# Patient Record
Sex: Male | Born: 1960
Health system: Southern US, Community
[De-identification: ages and names within clinical notes are randomized; demographics above are authoritative.]

## PROBLEM LIST (undated history)

## (undated) DIAGNOSIS — F32A Depression, unspecified: Secondary | ICD-10-CM

## (undated) DIAGNOSIS — F329 Major depressive disorder, single episode, unspecified: Secondary | ICD-10-CM

## (undated) DIAGNOSIS — M47812 Spondylosis without myelopathy or radiculopathy, cervical region: Secondary | ICD-10-CM

## (undated) DIAGNOSIS — B1920 Unspecified viral hepatitis C without hepatic coma: Secondary | ICD-10-CM

## (undated) HISTORY — DX: Depression, unspecified: F32.A

## (undated) HISTORY — PX: TONSILLECTOMY: SUR1361

## (undated) HISTORY — PX: CHOLECYSTECTOMY: SHX55

## (undated) HISTORY — DX: Major depressive disorder, single episode, unspecified: F32.9

## (undated) HISTORY — PX: SPINE SURGERY: SHX786

---

## 2006-03-26 ENCOUNTER — Ambulatory Visit: Payer: Self-pay | Admitting: Internal Medicine

## 2006-04-01 ENCOUNTER — Ambulatory Visit: Payer: Self-pay | Admitting: Surgery

## 2006-08-31 ENCOUNTER — Emergency Department: Payer: Self-pay | Admitting: General Practice

## 2009-08-05 ENCOUNTER — Emergency Department: Payer: Self-pay | Admitting: Emergency Medicine

## 2009-08-08 ENCOUNTER — Emergency Department: Payer: Self-pay | Admitting: Internal Medicine

## 2011-03-16 ENCOUNTER — Emergency Department: Payer: Self-pay | Admitting: Emergency Medicine

## 2012-04-24 ENCOUNTER — Emergency Department: Payer: Self-pay | Admitting: Emergency Medicine

## 2015-06-27 ENCOUNTER — Emergency Department: Payer: Self-pay

## 2015-06-27 ENCOUNTER — Emergency Department
Admission: EM | Admit: 2015-06-27 | Discharge: 2015-06-27 | Disposition: A | Discharge status: Home / Self Care | Payer: Self-pay | Attending: Emergency Medicine | Admitting: Emergency Medicine

## 2015-06-27 ENCOUNTER — Encounter: Payer: Self-pay | Admitting: Emergency Medicine

## 2015-06-27 DIAGNOSIS — Z87891 Personal history of nicotine dependence: Secondary | ICD-10-CM | POA: Insufficient documentation

## 2015-06-27 DIAGNOSIS — M5412 Radiculopathy, cervical region: Secondary | ICD-10-CM | POA: Insufficient documentation

## 2015-06-27 MED ORDER — PREDNISONE 10 MG PO TABS: ORAL_TABLET | ORAL | Status: DC

## 2015-06-27 NOTE — ED Provider Notes (Signed)
[facility] Emergency Department Provider Note  ____________________________________________  Time seen: Approximately 9:28 AM  I have reviewed the triage vital signs and the nursing notes.   HISTORY  Chief Complaint Extremity Weakness   HPI Matthew Mercado is a 54 y.o. male with complaint of left arm pain for 1 month. Patient states that there is a pressure, burning sensation from his neck down his left arm that is minimally relieved by Advil which he is taking every 3 hours. Patient denies any GI upset at this time. Patient denies any chest pain or difficulty breathing. He denies any cardiac history. Patient states that he had a neck injury approximately 8 years ago where he fractured his neck. He denies any difficulty or problems since that time. Pain is worse with lifting his arm and pain is decreased with left armrest. He states that he feels like he has some weakness in his grip of his left hand. He took Advil this morning prior to his arrival in the emergency room. Currently he rates his pain a 4 out of 10.   History reviewed. No pertinent past medical history.  There are no active problems to display for this patient.   History reviewed. No pertinent past surgical history.  Current Outpatient Rx  Name  Route  Sig  Dispense  Refill  . predniSONE (DELTASONE) 10 MG tablet      Take 6 tablets  today, on day 2 take 5 tablets, day 3 take 4 tablets, day 4 take 3 tablets, day 5 take  2 tablets and 1 tablet the last day   21 tablet   0     Allergies Review of patient's allergies indicates no known allergies.  No family history on file.  Social History Social History  Substance Use Topics  . Smoking status: Former Research scientist (life sciences)  . Smokeless tobacco: None  . Alcohol Use: No    Review of Systems Constitutional: No fever/chills ENT: No sore throat. Cardiovascular: Denies chest pain. Respiratory: Denies shortness of breath. Gastrointestinal:   No  nausea, no vomiting.  Musculoskeletal: Negative for back pain. Positive left arm pain. Skin: Negative for rash. Neurological: Negative for headaches, focal weakness or numbness.  10-point ROS otherwise negative.  ____________________________________________   PHYSICAL EXAM:  VITAL SIGNS: ED Triage Vitals  Enc Vitals Group     BP 06/27/15 0910 149/89 mmHg     Pulse Rate 06/27/15 0910 74     Resp 06/27/15 0910 18     Temp 06/27/15 0910 98.9 F (37.2 C)     Temp Source 06/27/15 0910 Oral     SpO2 06/27/15 0910 96 %     Weight 06/27/15 0910 205 lb (92.987 kg)     Height 06/27/15 0910 5\' 11"  (1.803 m)     Head Cir --      Peak Flow --      Pain Score 06/27/15 0910 4     Pain Loc --      Pain Edu? --      Excl. in Center? --     Constitutional: Alert and oriented. Well appearing and in no acute distress. Eyes: Conjunctivae are normal. PERRL. EOMI. Head: Atraumatic. Nose: No congestion/rhinnorhea. Neck: No stridor.  Minimal tenderness on palpation of cervical spine. Range of motion is guarded. Cardiovascular: Normal rate, regular rhythm. Grossly normal heart sounds.  Good peripheral circulation. Respiratory: Normal respiratory effort.  No retractions. Lungs CTAB. Gastrointestinal: Soft and nontender. No distention. No abdominal bruits. No CVA tenderness.  Musculoskeletal: As a motion of the neck and left arm no gross deformity was noted. Range of motion of left arm is restricted secondary to discomfort. There is some minimal tenderness on palpation of the cervical spine posteriorly. There is minimally decreased strength in the left arm in comparison to the right with gripping. Good muscle strength otherwise. Neurologic:  Normal speech and language. No gross focal neurologic deficits are appreciated. No gait instability. Skin:  Skin is warm, dry and intact. No rash noted. Psychiatric: Mood and affect are normal. Speech and behavior are  normal.  ____________________________________________   LABS (all labs ordered are listed, but only abnormal results are displayed)  Labs Reviewed - No data to display RADIOLOGY  X-rays cervical spine per radiologist shows minimal degenerative disc disease noted at C5-C6 with minimal grade 1 anterolisthesis ____________________________________________   PROCEDURES  Procedure(s) performed: None  Critical Care performed: No  ____________________________________________   INITIAL IMPRESSION / ASSESSMENT AND PLAN / ED COURSE  Pertinent labs & imaging results that were available during my care of the patient were reviewed by me and considered in my medical decision making (see chart for details).  Patient was started on prednisone Dosepak tapering from 60 mg. Patient was given the name of Dr. Rudene Christians at [facility] clinic orthopedics to follow-up with. ____________________________________________   FINAL CLINICAL IMPRESSION(S) / ED DIAGNOSES  Final diagnoses:  Cervical radiculopathy, acute      Johnn Hai, PA-C 06/27/15 [place], MD 06/27/15 1549

## 2015-06-27 NOTE — ED Provider Notes (Signed)
EKG read and interpreted by me shows normal sinus rhythm rate of 67 normal axis no acute ST-T wave changes EKG is essentially normal  Nena Polio, MD 06/27/15 1039

## 2015-06-27 NOTE — ED Notes (Signed)
Left arm pain/ radiating / numbness/ tingling increasing x1 month , no injury recalled

## 2015-06-27 NOTE — Discharge Instructions (Signed)
Cervical Radiculopathy Cervical radiculopathy means that a nerve in the neck is pinched or bruised. This can cause pain or loss of feeling (numbness) that runs from your neck to your arm and fingers. HOME CARE Managing Pain  Take over-the-counter and prescription medicines only as told by your doctor.  If directed, put ice on the injured or painful area.  Put ice in a plastic bag.  Place a towel between your skin and the bag.  Leave the ice on for 20 minutes, 2-3 times per day.  If ice does not help, you can try using heat. Take a warm shower or warm bath, or use a heat pack as told by your doctor.  You may try a gentle neck and shoulder massage. Activity  Rest as needed. Follow instructions from your doctor about any activities to avoid.  Do exercises as told by your doctor or physical therapist. General Instructions   If you were given a soft collar, wear it as told by your doctor.  Use a flat pillow when you sleep.  Keep all follow-up visits as told by your doctor. This is important. GET HELP IF:  Your condition does not improve with treatment. GET HELP RIGHT AWAY IF:   Your pain gets worse and is not controlled with medicine.  You lose feeling or feel weak in your hand, arm, face, or leg.  You have a fever.  You have a stiff neck.  You cannot control when you poop or pee (have incontinence).  You have trouble with walking, balance, or talking.   This information is not intended to replace advice given to you by your health care provider. Make sure you discuss any questions you have with your health care provider.   Document Released: 06/04/2011 Document Revised: 03/06/2015 Document Reviewed: 08/09/2014 Elsevier Interactive Patient Education 2016 [employer].   Call and make an appointment with Dr. Rudene Christians for your cervical radicular pain Begin taking prednisone today as directed.

## 2015-06-27 NOTE — ED Notes (Signed)
States he developed pain to left arm for over a month. No injury. Describes pain as pressure at times then burning min relief with OTC ibuprofen  States his grips area weaker on left at times    Good pulses and circulation  Denies any other sx;s no cp or SOB

## 2015-07-11 DIAGNOSIS — M5412 Radiculopathy, cervical region: Secondary | ICD-10-CM | POA: Diagnosis not present

## 2015-07-22 ENCOUNTER — Emergency Department
Admission: EM | Admit: 2015-07-22 | Discharge: 2015-07-23 | Disposition: A | Discharge status: Home / Self Care | Payer: 59 | Attending: Emergency Medicine | Admitting: Emergency Medicine

## 2015-07-22 ENCOUNTER — Encounter: Payer: Self-pay | Admitting: Emergency Medicine

## 2015-07-22 DIAGNOSIS — R000 Tachycardia, unspecified: Secondary | ICD-10-CM | POA: Diagnosis not present

## 2015-07-22 DIAGNOSIS — K529 Noninfective gastroenteritis and colitis, unspecified: Secondary | ICD-10-CM | POA: Diagnosis not present

## 2015-07-22 DIAGNOSIS — E860 Dehydration: Secondary | ICD-10-CM | POA: Diagnosis not present

## 2015-07-22 DIAGNOSIS — R112 Nausea with vomiting, unspecified: Secondary | ICD-10-CM | POA: Diagnosis present

## 2015-07-22 DIAGNOSIS — Z7952 Long term (current) use of systemic steroids: Secondary | ICD-10-CM | POA: Diagnosis not present

## 2015-07-22 DIAGNOSIS — Z87891 Personal history of nicotine dependence: Secondary | ICD-10-CM | POA: Diagnosis not present

## 2015-07-22 LAB — COMPREHENSIVE METABOLIC PANEL WITH GFR
ALT: 108 U/L — ABNORMAL HIGH (ref 17–63)
AST: 63 U/L — ABNORMAL HIGH (ref 15–41)
Albumin: 5 g/dL (ref 3.5–5.0)
Alkaline Phosphatase: 62 U/L (ref 38–126)
Anion gap: 16 — ABNORMAL HIGH (ref 5–15)
BUN: 26 mg/dL — ABNORMAL HIGH (ref 6–20)
CO2: 22 mmol/L (ref 22–32)
Calcium: 9.6 mg/dL (ref 8.9–10.3)
Chloride: 103 mmol/L (ref 101–111)
Creatinine, Ser: 1.22 mg/dL (ref 0.61–1.24)
GFR calc Af Amer: >60 mL/min
GFR calc non Af Amer: >60 mL/min
Glucose, Bld: 171 mg/dL — ABNORMAL HIGH (ref 65–99)
Potassium: 4.1 mmol/L (ref 3.5–5.1)
Sodium: 141 mmol/L (ref 135–145)
Total Bilirubin: 1.7 mg/dL — ABNORMAL HIGH (ref 0.3–1.2)
Total Protein: 8.3 g/dL — ABNORMAL HIGH (ref 6.5–8.1)

## 2015-07-22 LAB — CBC WITH DIFFERENTIAL/PLATELET
Basophils Absolute: 0.1 K/uL (ref 0–0.1)
Basophils Relative: 0 %
Eosinophils Absolute: 0.2 K/uL (ref 0–0.7)
Eosinophils Relative: 1 %
HCT: 54.7 % — ABNORMAL HIGH (ref 40.0–52.0)
Hemoglobin: 18.8 g/dL — ABNORMAL HIGH (ref 13.0–18.0)
Lymphocytes Relative: 6 %
Lymphs Abs: 1.4 K/uL (ref 1.0–3.6)
MCH: 31.2 pg (ref 26.0–34.0)
MCHC: 34.4 g/dL (ref 32.0–36.0)
MCV: 90.7 fL (ref 80.0–100.0)
Monocytes Absolute: 1.7 K/uL — ABNORMAL HIGH (ref 0.2–1.0)
Monocytes Relative: 8 %
Neutro Abs: 18.2 K/uL — ABNORMAL HIGH (ref 1.4–6.5)
Neutrophils Relative %: 85 %
Platelets: 373 K/uL (ref 150–440)
RBC: 6.03 MIL/uL — ABNORMAL HIGH (ref 4.40–5.90)
RDW: 13.7 % (ref 11.5–14.5)
WBC: 21.5 K/uL — ABNORMAL HIGH (ref 3.8–10.6)

## 2015-07-22 LAB — ETHANOL: Alcohol, Ethyl (B): <5 mg/dL

## 2015-07-22 LAB — LIPASE, BLOOD: LIPASE: 32 U/L (ref 11–51)

## 2015-07-22 MED ORDER — ONDANSETRON HCL 4 MG/2ML IJ SOLN
4.0000 mg | Freq: Once | INTRAMUSCULAR | Status: AC
  Administered 2015-07-22: 4 mg via INTRAVENOUS
  Filled 2015-07-22: qty 2

## 2015-07-22 MED ORDER — SODIUM CHLORIDE 0.9 % IV BOLUS (SEPSIS)
1000.0000 mL | Freq: Once | INTRAVENOUS | Status: AC
  Administered 2015-07-22: 1000 mL via INTRAVENOUS

## 2015-07-22 NOTE — ED Notes (Signed)
Patient repositioned to recliner chair for comfort. Much improved. Wife at bedside. Awaiting fluids to be completed and will draw labs.

## 2015-07-22 NOTE — ED Notes (Addendum)
Pt to triage via w/c, mask in place with no distress noted; Pt reports N/V/D x 3hrs; denies abd pain at present but st having abd craming with vomiting episode

## 2015-07-22 NOTE — ED Provider Notes (Addendum)
[facility] Emergency Department Provider Note  ____________________________________________   I have reviewed the triage vital signs and the nursing notes.   HISTORY  Chief Complaint Emesis and Diarrhea    HPI Matthew Mercado is a 55 y.o. male who presents today complaining of nausea vomiting and diarrhea. Patient has had these symptoms since approximately 4 PM this evening. He has vomited more times and he can count. He has had copious nonbloody nonbilious emesis and extensive nonbloody non-melanotic brown diarrhea. Positive sick contacts large community burden of similar. Patient denies any fever or chills. He states that he began to feel badly this afternoon. He denies any focal abdominal pain and states his cramping is gone. He states he filled up an entire trash bucket with vomit. It is unclear how they've the trash bucket was.  History reviewed. No pertinent past medical history.  There are no active problems to display for this patient.   Past Surgical History  Procedure Laterality Date  . Cholecystectomy    . Tonsillectomy      Current Outpatient Rx  Name  Route  Sig  Dispense  Refill  . predniSONE (DELTASONE) 10 MG tablet      Take 6 tablets  today, on day 2 take 5 tablets, day 3 take 4 tablets, day 4 take 3 tablets, day 5 take  2 tablets and 1 tablet the last day   21 tablet   0     Allergies Review of patient's allergies indicates no known allergies.  No family history on file.  Social History Social History  Substance Use Topics  . Smoking status: Former Research scientist (life sciences)  . Smokeless tobacco: None  . Alcohol Use: No    Review of Systems Constitutional: No fever/chills Eyes: No visual changes. ENT: No sore throat. No stiff neck no neck pain Cardiovascular: Denies chest pain. Respiratory: Denies shortness of breath. Gastrointestinal:  See history of present illness Genitourinary: Negative for dysuria. Musculoskeletal: Negative  lower extremity swelling Skin: Negative for rash. Neurological: Negative for headaches, focal weakness or numbness. 10-point ROS otherwise negative.  ____________________________________________   PHYSICAL EXAM:  VITAL SIGNS: ED Triage Vitals  Enc Vitals Group     BP 07/22/15 2112 155/125 mmHg     Pulse Rate 07/22/15 2112 131     Resp 07/22/15 2112 24     Temp 07/22/15 2112 97.7 F (36.5 C)     Temp Source 07/22/15 2112 Oral     SpO2 07/22/15 2112 98 %     Weight 07/22/15 2112 210 lb (95.255 kg)     Height 07/22/15 2112 5\' 11"  (1.803 m)     Head Cir --      Peak Flow --      Pain Score 07/22/15 2122 2     Pain Loc --      Pain Edu? --      Excl. in [place]? --     Constitutional: Alert and oriented. Well appearing and in no acute distress. Eyes: Conjunctivae are normal. PERRL. EOMI. Head: Atraumatic. Nose: No congestion/rhinnorhea. Mouth/Throat: Mucous membranes are somewhat dry.  Oropharynx non-erythematous. Neck: No stridor.   Nontender with no meningismus Cardiovascular: Tachycardia noted, mild, regular rhythm. Grossly normal heart sounds.  Good peripheral circulation. Respiratory: Normal respiratory effort.  No retractions. Lungs CTAB. Abdominal: Soft and nontender. No distention. No guarding no rebound Back:  There is no focal tenderness or step off there is no midline tenderness there are no lesions noted. there is no CVA  tenderness Musculoskeletal: No lower extremity tenderness. No joint effusions, no DVT signs strong distal pulses no edema Neurologic:  Normal speech and language. No gross focal neurologic deficits are appreciated.  Skin:  Skin is warm, dry and intact. No rash noted. Psychiatric: Mood and affect are normal. Speech and behavior are normal.  ____________________________________________   LABS (all labs ordered are listed, but only abnormal results are displayed)  Labs Reviewed  CBC WITH DIFFERENTIAL/PLATELET - Abnormal; Notable for the following:     WBC 21.5 (*)    RBC 6.03 (*)    Hemoglobin 18.8 (*)    HCT 54.7 (*)    Neutro Abs 18.2 (*)    Monocytes Absolute 1.7 (*)    All other components within normal limits  COMPREHENSIVE METABOLIC PANEL  ETHANOL  LIPASE, BLOOD   ____________________________________________  EKG  I personally interpreted any EKGs ordered by me or triage  ____________________________________________  RADIOLOGY  I reviewed any imaging ordered by me or triage that were performed during my shift ____________________________________________   PROCEDURES  Procedure(s) performed: None  Critical Care performed: None  ____________________________________________   INITIAL IMPRESSION / ASSESSMENT AND PLAN / ED COURSE  Pertinent labs & imaging results that were available during my care of the patient were reviewed by me and considered in my medical decision making (see chart for details).  A sheet with explosive diarrhea and vomiting tonight. Large viral burden of similar. Blood pressure is reassuring. No focal abdominal discomfort to deep abdominal palpation. No evidence of obstruction or other acute intra-abdominal pathology requiring surgery or imaging at this time. We are giving the patient copious IV fluid, his white count is elevated but it is also noted that his hemoglobin is 18.8 suggestive of significant contractile leukocytosis/polycythemia likely secondary to acute volume depletion. In addition, patient is finishing up a steroid taper for his chronic neck pain. This is more than enough to send up his white count as well. We will replace his volume and reassess. I don't think a white count on its own mandates further intervention. There is no evidence at this time of the GI bleed and is heme certainly is not consistent with that either.  ----------------------------------------- 10:58 PM on 07/22/2015 -----------------------------------------  Patient feeling much better, he has serial  abdominal exams show no evidence of discomfort. Patient's sugar is elevated. Patient is taking steroids for chronic neck pain. The family the need for close outpatient follow-up and repeat his hemoglobin A1c. We will repeat his BMP to make sure that his anion gap closes. I suspect however it is because of acute dehydration. His oxygen saturation are 98 when I'm in the room.   ----------------------------------------- 11:32 PM on 07/22/2015 -----------------------------------------  Signed out to dr. Dahlia Client at the end of my shift. ____________________________________________   FINAL CLINICAL IMPRESSION(S) / ED DIAGNOSES  Final diagnoses:  None    Schuyler Amor, MD 07/22/15 2154  Schuyler Amor, MD 07/22/15 [place], MD 07/22/15 [place], MD 07/22/15 2333

## 2015-07-23 DIAGNOSIS — Z7952 Long term (current) use of systemic steroids: Secondary | ICD-10-CM | POA: Diagnosis not present

## 2015-07-23 DIAGNOSIS — R000 Tachycardia, unspecified: Secondary | ICD-10-CM | POA: Diagnosis not present

## 2015-07-23 DIAGNOSIS — Z87891 Personal history of nicotine dependence: Secondary | ICD-10-CM | POA: Diagnosis not present

## 2015-07-23 DIAGNOSIS — K529 Noninfective gastroenteritis and colitis, unspecified: Secondary | ICD-10-CM | POA: Diagnosis not present

## 2015-07-23 LAB — BASIC METABOLIC PANEL
ANION GAP: 4 — AB (ref 5–15)
BUN: 27 mg/dL — ABNORMAL HIGH (ref 6–20)
CO2: 26 mmol/L (ref 22–32)
Calcium: 7.7 mg/dL — ABNORMAL LOW (ref 8.9–10.3)
Chloride: 112 mmol/L — ABNORMAL HIGH (ref 101–111)
Creatinine, Ser: 1.18 mg/dL (ref 0.61–1.24)
GLUCOSE: 123 mg/dL — AB (ref 65–99)
POTASSIUM: 3.7 mmol/L (ref 3.5–5.1)
SODIUM: 142 mmol/L (ref 135–145)

## 2015-07-23 LAB — CBC WITH DIFFERENTIAL/PLATELET
BASOS ABS: 0 10*3/uL (ref 0–0.1)
Basophils Relative: 0 %
Eosinophils Absolute: 0.2 10*3/uL (ref 0–0.7)
Eosinophils Relative: 1 %
HEMATOCRIT: 45 % (ref 40.0–52.0)
HEMOGLOBIN: 15.3 g/dL (ref 13.0–18.0)
LYMPHS PCT: 7 %
Lymphs Abs: 1.1 10*3/uL (ref 1.0–3.6)
MCH: 31.8 pg (ref 26.0–34.0)
MCHC: 34 g/dL (ref 32.0–36.0)
MCV: 93.4 fL (ref 80.0–100.0)
Monocytes Absolute: 1.2 10*3/uL — ABNORMAL HIGH (ref 0.2–1.0)
Monocytes Relative: 8 %
NEUTROS ABS: 12.9 10*3/uL — AB (ref 1.4–6.5)
NEUTROS PCT: 84 %
Platelets: 228 10*3/uL (ref 150–440)
RBC: 4.81 MIL/uL (ref 4.40–5.90)
RDW: 13.5 % (ref 11.5–14.5)
WBC: 15.4 10*3/uL — AB (ref 3.8–10.6)

## 2015-07-23 MED ORDER — ONDANSETRON 4 MG PO TBDP: 4.0000 mg | ORAL_TABLET | Freq: Three times a day (TID) | ORAL | Status: DC | PRN

## 2015-07-23 NOTE — ED Notes (Signed)
Pt alert and oriented X4, active, cooperative, pt in NAD. RR even and unlabored, color WNL.  Pt informed to return if any life threatening symptoms occur.   

## 2015-07-23 NOTE — ED Provider Notes (Signed)
-----------------------------------------   1:40 AM on 07/23/2015 -----------------------------------------   Blood pressure 115/81, pulse 107, temperature 97.7 F (36.5 C), temperature source Oral, resp. rate 18, height 5\' 11"  (1.803 m), weight 210 lb (95.255 kg), SpO2 94 %.  Assuming care from Dr. Burlene Arnt.  In short, Matthew Mercado is a 55 y.o. male with a chief complaint of Emesis and Diarrhea .  Refer to the original H&P for additional details.  The current plan of care is to follow up the repeat labs and reassess the patient.  Patient's repeat blood work is improved. His H&H is within normal limits and his white blood cell count is decreased at 15. The patient after the third liter feels much improved and is rated be discharged home. He did still have some tachycardia but he reports that he feels well and he thinks it is okay. I discussed this with the patient and his wife and he said he just felt ready to go home. The patient be discharged home to follow-up with his primary care physician.   Loney Hering, MD 07/23/15 [phone]

## 2015-07-23 NOTE — Discharge Instructions (Signed)

## 2015-07-29 DIAGNOSIS — M5412 Radiculopathy, cervical region: Secondary | ICD-10-CM | POA: Diagnosis not present

## 2015-08-01 DIAGNOSIS — M5412 Radiculopathy, cervical region: Secondary | ICD-10-CM | POA: Diagnosis not present

## 2015-08-06 DIAGNOSIS — M5412 Radiculopathy, cervical region: Secondary | ICD-10-CM | POA: Diagnosis not present

## 2015-08-09 ENCOUNTER — Other Ambulatory Visit: Payer: Self-pay | Admitting: Student

## 2015-08-09 DIAGNOSIS — M5412 Radiculopathy, cervical region: Secondary | ICD-10-CM

## 2015-08-28 ENCOUNTER — Ambulatory Visit
Admission: RE | Admit: 2015-08-28 | Discharge: 2015-08-28 | Disposition: A | Discharge status: Home / Self Care | Payer: 59 | Source: Ambulatory Visit | Attending: Student | Admitting: Student

## 2015-08-28 DIAGNOSIS — M47812 Spondylosis without myelopathy or radiculopathy, cervical region: Secondary | ICD-10-CM | POA: Insufficient documentation

## 2015-08-28 DIAGNOSIS — M50223 Other cervical disc displacement at C6-C7 level: Secondary | ICD-10-CM | POA: Diagnosis not present

## 2015-08-28 DIAGNOSIS — M5412 Radiculopathy, cervical region: Secondary | ICD-10-CM | POA: Diagnosis not present

## 2015-09-17 DIAGNOSIS — M542 Cervicalgia: Secondary | ICD-10-CM | POA: Diagnosis not present

## 2015-09-17 DIAGNOSIS — M5023 Other cervical disc displacement, cervicothoracic region: Secondary | ICD-10-CM | POA: Diagnosis not present

## 2015-09-17 DIAGNOSIS — M5412 Radiculopathy, cervical region: Secondary | ICD-10-CM | POA: Diagnosis not present

## 2015-09-17 DIAGNOSIS — Z6829 Body mass index (BMI) 29.0-29.9, adult: Secondary | ICD-10-CM | POA: Diagnosis not present

## 2015-09-23 DIAGNOSIS — M5412 Radiculopathy, cervical region: Secondary | ICD-10-CM | POA: Diagnosis not present

## 2015-09-30 ENCOUNTER — Ambulatory Visit (INDEPENDENT_AMBULATORY_CARE_PROVIDER_SITE_OTHER): Payer: 59

## 2015-09-30 ENCOUNTER — Ambulatory Visit
Admission: EM | Admit: 2015-09-30 | Discharge: 2015-09-30 | Disposition: A | Discharge status: Home / Self Care | Payer: 59 | Attending: Family Medicine | Admitting: Family Medicine

## 2015-09-30 ENCOUNTER — Encounter: Payer: Self-pay | Admitting: *Deleted

## 2015-09-30 DIAGNOSIS — S20211A Contusion of right front wall of thorax, initial encounter: Secondary | ICD-10-CM

## 2015-09-30 DIAGNOSIS — R05 Cough: Secondary | ICD-10-CM | POA: Diagnosis not present

## 2015-09-30 DIAGNOSIS — R0781 Pleurodynia: Secondary | ICD-10-CM | POA: Diagnosis not present

## 2015-09-30 HISTORY — DX: Spondylosis without myelopathy or radiculopathy, cervical region: M47.812

## 2015-09-30 LAB — URINALYSIS COMPLETE WITH MICROSCOPIC (ARMC ONLY)
BACTERIA UA: NONE SEEN
Bilirubin Urine: NEGATIVE
GLUCOSE, UA: NEGATIVE mg/dL
HGB URINE DIPSTICK: NEGATIVE
Ketones, ur: NEGATIVE mg/dL
LEUKOCYTES UA: NEGATIVE
NITRITE: NEGATIVE
PROTEIN: NEGATIVE mg/dL
RBC / HPF: NONE SEEN RBC/hpf (ref 0–5)
Specific Gravity, Urine: 1.025 (ref 1.005–1.030)
Squamous Epithelial / LPF: NONE SEEN
pH: 6 (ref 5.0–8.0)

## 2015-09-30 MED ORDER — KETOROLAC TROMETHAMINE 60 MG/2ML IM SOLN
60.0000 mg | Freq: Once | INTRAMUSCULAR | Status: AC
  Administered 2015-09-30: 60 mg via INTRAMUSCULAR

## 2015-09-30 MED ORDER — HYDROCODONE-ACETAMINOPHEN 5-325 MG PO TABS: 1.0000 | ORAL_TABLET | Freq: Four times a day (QID) | ORAL | Status: DC | PRN

## 2015-09-30 NOTE — ED Notes (Signed)
Pt fell last Thursday against a lawn mower but thought he was not injured. Over the weekend has had right flank and back pain that is worse today. Has hx of cervical disc problems and scheduled for surgery 4/26.

## 2015-09-30 NOTE — Discharge Instructions (Signed)
Chest Contusion A chest contusion is a deep bruise on your chest area. Contusions are the result of an injury that caused bleeding under the skin. A chest contusion may involve bruising of the skin, muscles, or ribs. The contusion may turn blue, purple, or yellow. Minor injuries will give you a painless contusion, but more severe contusions may stay painful and swollen for a few weeks. CAUSES  A contusion is usually caused by a blow, trauma, or direct force to an area of the body. SYMPTOMS   Swelling and redness of the injured area.  Discoloration of the injured area.  Tenderness and soreness of the injured area.  Pain. DIAGNOSIS  The diagnosis can be made by taking a history and performing a physical exam. An X-ray, CT scan, or MRI may be needed to determine if there were any associated injuries, such as broken bones (fractures) or internal injuries. TREATMENT  Often, the best treatment for a chest contusion is resting, icing, and applying cold compresses to the injured area. Deep breathing exercises may be recommended to reduce the risk of pneumonia. Over-the-counter medicines may also be recommended for pain control. HOME CARE INSTRUCTIONS   Put ice on the injured area.  Put ice in a plastic bag.  Place a towel between your skin and the bag.  Leave the ice on for 15-20 minutes, 03-04 times a day.  Only take over-the-counter or prescription medicines as directed by your caregiver. Your caregiver may recommend avoiding anti-inflammatory medicines (aspirin, ibuprofen, and naproxen) for 48 hours because these medicines may increase bruising.  Rest the injured area.  Perform deep-breathing exercises as directed by your caregiver.  Stop smoking if you smoke.  Do not lift objects over 5 pounds (2.3 kg) for 3 days or longer if recommended by your caregiver. SEEK IMMEDIATE MEDICAL CARE IF:   You have increased bruising or swelling.  You have pain that is getting worse.  You have  difficulty breathing.  You have dizziness, weakness, or fainting.  You have blood in your urine or stool.  You cough up or vomit blood.  Your swelling or pain is not relieved with medicines. MAKE SURE YOU:   Understand these instructions.  Will watch your condition.  Will get help right away if you are not doing well or get worse.   This information is not intended to replace advice given to you by your health care provider. Make sure you discuss any questions you have with your health care provider.   Document Released: 03/10/2001 Document Revised: 03/09/2012 Document Reviewed: 12/07/2011 Elsevier Interactive Patient Education 2016 [employer]  

## 2015-09-30 NOTE — ED Provider Notes (Signed)
CSN: [id number]     Arrival date & time 09/30/15  1501 History   First MD Initiated Contact with Patient 09/30/15 1600     Chief Complaint  Patient presents with  . Flank Pain  . Back Pain   (Consider location/radiation/quality/duration/timing/severity/associated sxs/prior Treatment) HPI  This a 55 year old male who was states that 4 days ago he was pushing a  900 pound lawnmower up a ramp onto a trailer when he tripped and fell against the cutting deck striking his right lateral ribs. he did not have pain at first but , a day and a half to 2 days later started to have severe pain. He says that it hurts worse with inspiration and sneezing is "unbearable". Has not Experienced any shortness of breath- his O2 sat today is 97% on room air. He has never had kidney stones before. Motion, particularly changing positions, is what seems to be the most bothersome for him. He seems to be comfortable at rest.    Past Medical History  Diagnosis Date  . Cervical spine degeneration    Past Surgical History  Procedure Laterality Date  . Cholecystectomy    . Tonsillectomy     Family History  Problem Relation Age of Onset  . Cancer Mother   . Cancer Father    Social History  Substance Use Topics  . Smoking status: Former Research scientist (life sciences)  . Smokeless tobacco: None  . Alcohol Use: No    Review of Systems  Constitutional: Positive for activity change. Negative for fever, chills and fatigue.  Musculoskeletal: Positive for back pain.  All other systems reviewed and are negative.   Allergies  Review of patient's allergies indicates no known allergies.  Home Medications   Prior to Admission medications   Medication Sig Start Date End Date Taking? Authorizing Provider  cyclobenzaprine (FLEXERIL) 10 MG tablet Take 10 mg by mouth 3 (three) times daily as needed for muscle spasms.   Yes Historical Provider, MD  HYDROcodone-acetaminophen (NORCO/VICODIN) 5-325 MG tablet Take 1-2 tablets by mouth every 6  (six) hours as needed. 09/30/15   Lorin Picket, PA-C  ondansetron (ZOFRAN ODT) 4 MG disintegrating tablet Take 1 tablet (4 mg total) by mouth every 8 (eight) hours as needed for nausea or vomiting. 07/23/15   Loney Hering, MD  predniSONE (DELTASONE) 10 MG tablet Take 6 tablets  today, on day 2 take 5 tablets, day 3 take 4 tablets, day 4 take 3 tablets, day 5 take  2 tablets and 1 tablet the last day 06/27/15   Johnn Hai, PA-C   Meds Ordered and Administered this Visit   Medications  ketorolac (TORADOL) injection 60 mg (60 mg Intramuscular Given 09/30/15 1703)    BP 146/94 mmHg  Pulse 85  Temp(Src) 97.7 F (36.5 C) (Oral)  Resp 16  Ht 5\' 11"  (1.803 m)  Wt 205 lb (92.987 kg)  BMI 28.60 kg/m2  SpO2 97% No data found.   Physical Exam  Constitutional: He is oriented to person, place, and time. He appears well-developed and well-nourished. No distress.  HENT:  Head: Normocephalic and atraumatic.  Eyes: Conjunctivae are normal. Pupils are equal, round, and reactive to light.  Neck: Normal range of motion. Neck supple.  Pulmonary/Chest: Effort normal.  Slightly decreased breath sounds in the right lower lobe  Abdominal: Soft. Bowel sounds are normal. There is no tenderness. There is no rebound and no guarding.  Musculoskeletal: He exhibits tenderness. He exhibits no edema.  Examination of the right ribs  shows a tenderness in the mid scapular line at approximately T89. This reproduces symptoms. Thoracic rotation to the right does also reproduce symptoms. Not Much tenderness laterally.  Neurological: He is alert and oriented to person, place, and time.  Skin: Skin is warm and dry. He is not diaphoretic.  Psychiatric: He has a normal mood and affect. His behavior is normal. Judgment and thought content normal.  Nursing note and vitals reviewed.   ED Course  Procedures (including critical care time)  Labs Review Labs Reviewed  URINALYSIS COMPLETEWITH MICROSCOPIC [facility]  ONLY)    Imaging Review Dg Ribs Unilateral W/chest Right  09/30/2015  CLINICAL DATA:  Fall 5 days ago. Pain after cough today. Initial encounter. EXAM: RIGHT RIBS AND CHEST - 3+ VIEW COMPARISON:  04/24/2012 FINDINGS: No fracture or other bone lesions are seen involving the ribs. There is no evidence of pneumothorax or pleural effusion. Both lungs are clear. Heart size and mediastinal contours are within normal limits. IMPRESSION: Negative. Electronically Signed   By: Monte Fantasia M.D.   On: 09/30/2015 16:47     Visual Acuity Review  Right Eye Distance:   Left Eye Distance:   Bilateral Distance:    Right Eye Near:   Left Eye Near:    Bilateral Near:         MDM   1. Contusion of ribs, right, initial encounter    New Prescriptions   HYDROCODONE-ACETAMINOPHEN (NORCO/VICODIN) 5-325 MG TABLET    Take 1-2 tablets by mouth every 6 (six) hours as needed.  Plan: 1. Test/x-ray results and diagnosis reviewed with patient 2. rx as per orders; risks, benefits, potential side effects reviewed with patient 3. Recommend supportive treatment with Heat or ice as necessary. I recommended the use of Motrin for less severe pain but given him some Vicodin for more severe pain. He needs to cough and deep breathe frequently supporting the area that is painful. If he needs any changes or worsening he needs to return here go the emergency room or see his primary care physician. I've given 2 days off work but have recommended that he be careful when lifting or pushing or pulling which may exaggerate his pain. May also prolong his recovery. 4. F/u prn if symptoms worsen or don't improve      Lorin Picket, PA-C 09/30/15 1710

## 2015-10-23 DIAGNOSIS — M50223 Other cervical disc displacement at C6-C7 level: Secondary | ICD-10-CM | POA: Diagnosis not present

## 2015-11-08 DIAGNOSIS — M542 Cervicalgia: Secondary | ICD-10-CM | POA: Diagnosis not present

## 2015-11-12 ENCOUNTER — Ambulatory Visit (INDEPENDENT_AMBULATORY_CARE_PROVIDER_SITE_OTHER): Payer: 59 | Admitting: Family Medicine

## 2015-11-12 ENCOUNTER — Encounter: Payer: Self-pay | Admitting: Family Medicine

## 2015-11-12 VITALS — BP 113/80 | HR 88 | Temp 97.9°F | Ht 70.2 in | Wt 203.0 lb

## 2015-11-12 DIAGNOSIS — Z0000 Encounter for general adult medical examination without abnormal findings: Secondary | ICD-10-CM

## 2015-11-12 DIAGNOSIS — Z1211 Encounter for screening for malignant neoplasm of colon: Secondary | ICD-10-CM

## 2015-11-12 DIAGNOSIS — R7309 Other abnormal glucose: Secondary | ICD-10-CM | POA: Diagnosis not present

## 2015-11-12 LAB — URINALYSIS, ROUTINE W REFLEX MICROSCOPIC
Bilirubin, UA: NEGATIVE
Glucose, UA: NEGATIVE
Ketones, UA: NEGATIVE
LEUKOCYTES UA: NEGATIVE
NITRITE UA: NEGATIVE
PH UA: 5.5 (ref 5.0–7.5)
Protein, UA: NEGATIVE
RBC, UA: NEGATIVE
Specific Gravity, UA: 1.02 (ref 1.005–1.030)
UUROB: 1 mg/dL (ref 0.2–1.0)

## 2015-11-12 LAB — BAYER DCA HB A1C WAIVED: HB A1C (BAYER DCA - WAIVED): 5.1 % (ref <7.0)

## 2015-11-12 NOTE — Progress Notes (Signed)
BP 113/80 mmHg  Pulse 88  Temp(Src) 97.9 F (36.6 C)  Ht 5' 10.2" (1.783 m)  Wt 203 lb (92.08 kg)  BMI 28.96 kg/m2  SpO2 99%   Subjective:    Patient ID: Matthew Mercado, male    DOB: 1960/10/31, 55 y.o.   MRN: [id number]  HPI: Matthew Mercado is a 55 y.o. male  Chief Complaint  Patient presents with  . re-establish care  . Annual Exam  Patient all in all doing well had cervical disc surgery for left arm radicular pain which resolved's day of surgery and is been recovering well. Has been off pain medicines from day 3 after surgery. Prior to that it's been doing well was treated in the ER for chest pain with a noncardiac diagnosis normal EKG. not taking any medication now Glucose nonfasting was noted to be high in the emergency room no mention of glucose problems during surgery but with those issues will check hemoglobin A1c today. Patient also needs colonoscopy as hasn't had over 50 colonoscopy yet.   Relevant past medical, surgical, family and social history reviewed and updated as indicated. Interim medical history since our last visit reviewed. Allergies and medications reviewed and updated.  Review of Systems  Constitutional: Negative.   HENT: Negative.   Eyes: Negative.   Respiratory: Negative.   Cardiovascular: Negative.   Gastrointestinal: Negative.   Endocrine: Negative.   Genitourinary: Negative.   Musculoskeletal: Negative.   Skin: Negative.   Allergic/Immunologic: Negative.   Neurological: Negative.   Hematological: Negative.   Psychiatric/Behavioral: Negative.     Per HPI unless specifically indicated above     Objective:    BP 113/80 mmHg  Pulse 88  Temp(Src) 97.9 F (36.6 C)  Ht 5' 10.2" (1.783 m)  Wt 203 lb (92.08 kg)  BMI 28.96 kg/m2  SpO2 99%  Wt Readings from Last 3 Encounters:  11/12/15 203 lb (92.08 kg)  09/30/15 205 lb (92.987 kg)  07/22/15 210 lb (95.255 kg)    Physical Exam  Constitutional: He is oriented to person,  place, and time. He appears well-developed and well-nourished.  HENT:  Head: Normocephalic and atraumatic.  Right Ear: External ear normal.  Left Ear: External ear normal.  Eyes: Conjunctivae and EOM are normal. Pupils are equal, round, and reactive to light.  Neck: Normal range of motion. Neck supple.  Cardiovascular: Normal rate, regular rhythm, normal heart sounds and intact distal pulses.   Pulmonary/Chest: Effort normal and breath sounds normal.  Abdominal: Soft. Bowel sounds are normal. There is no splenomegaly or hepatomegaly.  Genitourinary: Rectum normal, prostate normal and penis normal.  Musculoskeletal: Normal range of motion.  Neurological: He is alert and oriented to person, place, and time. He has normal reflexes.  Skin: No rash noted. No erythema.  Psychiatric: He has a normal mood and affect. His behavior is normal. Judgment and thought content normal.        Assessment & Plan:   Problem List Items Addressed This Visit    None    Visit Diagnoses    Colon cancer screening    -  Primary    Relevant Orders    Ambulatory referral to General Surgery    PE (physical exam), annual        Relevant Orders    Comprehensive metabolic panel    Lipid panel    CBC with Differential/Platelet    TSH    Urinalysis, Routine w reflex microscopic (not at [facility])    PSA  Elevated glucose        History of elevated glucose with normal hemoglobin A1c discussed diet exercise nutrition weight loss    Relevant Orders    Bayer DCA Hb A1c Waived        Follow up plan: Return in about 1 year (around 11/11/2016), or if symptoms worsen or fail to improve, for Physical Exam.

## 2015-11-13 ENCOUNTER — Telehealth: Payer: Self-pay | Admitting: Family Medicine

## 2015-11-13 DIAGNOSIS — R748 Abnormal levels of other serum enzymes: Secondary | ICD-10-CM

## 2015-11-13 LAB — COMPREHENSIVE METABOLIC PANEL
ALBUMIN: 4.3 g/dL (ref 3.5–5.5)
ALK PHOS: 84 IU/L (ref 39–117)
ALT: 70 IU/L — ABNORMAL HIGH (ref 0–44)
AST: 68 IU/L — ABNORMAL HIGH (ref 0–40)
Albumin/Globulin Ratio: 1.8 (ref 1.2–2.2)
BUN / CREAT RATIO: 10 (ref 9–20)
BUN: 11 mg/dL (ref 6–24)
Bilirubin Total: 0.7 mg/dL (ref 0.0–1.2)
CALCIUM: 9.4 mg/dL (ref 8.7–10.2)
CHLORIDE: 105 mmol/L (ref 96–106)
CO2: 26 mmol/L (ref 18–29)
CREATININE: 1.09 mg/dL (ref 0.76–1.27)
GFR, EST AFRICAN AMERICAN: 88 mL/min/{1.73_m2} (ref >59)
GFR, EST NON AFRICAN AMERICAN: 77 mL/min/{1.73_m2} (ref >59)
GLOBULIN, TOTAL: 2.4 g/dL (ref 1.5–4.5)
Glucose: 94 mg/dL (ref 65–99)
POTASSIUM: 4.9 mmol/L (ref 3.5–5.2)
SODIUM: 148 mmol/L — AB (ref 134–144)
TOTAL PROTEIN: 6.7 g/dL (ref 6.0–8.5)

## 2015-11-13 LAB — CBC WITH DIFFERENTIAL/PLATELET
Basophils Absolute: 0.1 10*3/uL (ref 0.0–0.2)
Basos: 1 %
EOS (ABSOLUTE): 0.9 10*3/uL — AB (ref 0.0–0.4)
EOS: 12 %
HEMATOCRIT: 50.5 % (ref 37.5–51.0)
Hemoglobin: 17.6 g/dL (ref 12.6–17.7)
IMMATURE GRANULOCYTES: 0 %
Immature Grans (Abs): 0 10*3/uL (ref 0.0–0.1)
LYMPHS: 23 %
Lymphocytes Absolute: 1.8 10*3/uL (ref 0.7–3.1)
MCH: 32.4 pg (ref 26.6–33.0)
MCHC: 34.9 g/dL (ref 31.5–35.7)
MCV: 93 fL (ref 79–97)
MONOS ABS: 0.5 10*3/uL (ref 0.1–0.9)
Monocytes: 7 %
NEUTROS PCT: 57 %
Neutrophils Absolute: 4.2 10*3/uL (ref 1.4–7.0)
PLATELETS: 346 10*3/uL (ref 150–379)
RBC: 5.43 x10E6/uL (ref 4.14–5.80)
RDW: 13.4 % (ref 12.3–15.4)
WBC: 7.5 10*3/uL (ref 3.4–10.8)

## 2015-11-13 LAB — PSA: PROSTATE SPECIFIC AG, SERUM: 0.7 ng/mL (ref 0.0–4.0)

## 2015-11-13 LAB — LIPID PANEL
Chol/HDL Ratio: 2.6 ratio units (ref 0.0–5.0)
Cholesterol, Total: 145 mg/dL (ref 100–199)
HDL: 55 mg/dL (ref >39)
LDL Calculated: 75 mg/dL (ref 0–99)
Triglycerides: 77 mg/dL (ref 0–149)
VLDL CHOLESTEROL CAL: 15 mg/dL (ref 5–40)

## 2015-11-13 LAB — TSH: TSH: 1.32 u[IU]/mL (ref 0.450–4.500)

## 2015-11-13 NOTE — Telephone Encounter (Signed)
Phone call Discussed with patient elevated liver enzymes patient had been taking a lot of Tylenol for arm pain which is stopped patient now doing fine not taking any more Tylenol will recheck ALT AST in 2-3 months for elevated liver enzymes.

## 2015-11-13 NOTE — Telephone Encounter (Signed)
-----   Message from Wynn Maudlin, [place] sent at 11/13/2015  2:16 PM EDT ----- labs

## 2015-12-04 ENCOUNTER — Other Ambulatory Visit: Payer: Self-pay

## 2015-12-04 ENCOUNTER — Telehealth: Payer: Self-pay

## 2015-12-04 MED ORDER — PEG 3350-KCL-NABCB-NACL-NASULF 236 G PO SOLR: 4000.0000 mL | Freq: Once | ORAL | Status: DC

## 2015-12-04 NOTE — Telephone Encounter (Signed)
Gastroenterology Pre-Procedure Review  Request Date: 01/14/16 Requesting Physician: Dr. Jeananne Rama  PATIENT REVIEW QUESTIONS: The patient responded to the following health history questions as indicated:    1. Are you having any GI issues? no 2. Do you have a personal history of Polyps? no 3. Do you have a family history of Colon Cancer or Polyps? yes (Mother, colon cancer) 4. Diabetes Mellitus? no 5. Joint replacements in the past 12 months?no 6. Major health problems in the past 3 months?no 7. Any artificial heart valves, MVP, or defibrillator?no    MEDICATIONS & ALLERGIES:    Patient reports the following regarding taking any anticoagulation/antiplatelet therapy:   Plavix, Coumadin, Eliquis, Xarelto, Lovenox, Pradaxa, Brilinta, or Effient? no Aspirin? no  Patient confirms/reports the following medications:  No current outpatient prescriptions on file.   No current facility-administered medications for this visit.    Patient confirms/reports the following allergies:  No Known Allergies  No orders of the defined types were placed in this encounter.    AUTHORIZATION INFORMATION Primary Insurance: 1D#: Group #:  Secondary Insurance: 1D#: Group #:  SCHEDULE INFORMATION: Date: 01/14/16 Time: Location: [place]

## 2015-12-16 ENCOUNTER — Telehealth: Payer: Self-pay

## 2015-12-16 ENCOUNTER — Other Ambulatory Visit: Payer: Self-pay

## 2015-12-16 NOTE — Telephone Encounter (Signed)
Gastroenterology Pre-Procedure Review  Request Date:  Requesting Physician: Dr.   PATIENT REVIEW QUESTIONS: The patient responded to the following health history questions as indicated:    1. Are you having any GI issues? no 2. Do you have a personal history of Polyps? no 3. Do you have a family history of Colon Cancer or Polyps? no 4. Diabetes Mellitus? no 5. Joint replacements in the past 12 months?no 6. Major health problems in the past 3 months?no 7. Any artificial heart valves, MVP, or defibrillator?no    MEDICATIONS & ALLERGIES:    Patient reports the following regarding taking any anticoagulation/antiplatelet therapy:   Plavix, Coumadin, Eliquis, Xarelto, Lovenox, Pradaxa, Brilinta, or Effient? no Aspirin? no  Patient confirms/reports the following medications:  Current Outpatient Prescriptions  Medication Sig Dispense Refill  . polyethylene glycol (GOLYTELY) 236 g solution Take 4,000 mLs by mouth once. Drink one 8 oz glass every 20 mins until stools are clear. 4000 mL 0   No current facility-administered medications for this visit.    Patient confirms/reports the following allergies:  No Known Allergies  No orders of the defined types were placed in this encounter.    AUTHORIZATION INFORMATION Primary Insurance: 1D#: Group #:  Secondary Insurance: 1D#: Group #:  SCHEDULE INFORMATION: Date:  Time: Location:

## 2015-12-20 ENCOUNTER — Telehealth: Payer: Self-pay | Admitting: Gastroenterology

## 2015-12-20 NOTE — Telephone Encounter (Signed)
Noted. [place] notified.

## 2015-12-20 NOTE — Telephone Encounter (Signed)
Patient wants to cancel his colonoscopy on 7/17. He will call back to reschedule

## 2016-01-13 ENCOUNTER — Ambulatory Visit: Admit: 2016-01-13 | Payer: 59 | Admitting: Gastroenterology

## 2016-01-13 SURGERY — COLONOSCOPY WITH PROPOFOL
Anesthesia: Choice

## 2016-01-14 ENCOUNTER — Ambulatory Visit: Admit: 2016-01-14 | Payer: 59 | Admitting: Gastroenterology

## 2016-01-14 SURGERY — COLONOSCOPY WITH PROPOFOL
Anesthesia: General

## 2016-03-03 DIAGNOSIS — M5412 Radiculopathy, cervical region: Secondary | ICD-10-CM | POA: Diagnosis not present

## 2016-03-03 DIAGNOSIS — Z6828 Body mass index (BMI) 28.0-28.9, adult: Secondary | ICD-10-CM | POA: Diagnosis not present

## 2016-03-03 DIAGNOSIS — M542 Cervicalgia: Secondary | ICD-10-CM | POA: Diagnosis not present

## 2016-04-08 ENCOUNTER — Encounter: Payer: Self-pay | Admitting: Emergency Medicine

## 2016-04-08 ENCOUNTER — Ambulatory Visit
Admission: EM | Admit: 2016-04-08 | Discharge: 2016-04-08 | Disposition: A | Discharge status: Home / Self Care | Payer: 59 | Attending: Family Medicine | Admitting: Family Medicine

## 2016-04-08 DIAGNOSIS — M5431 Sciatica, right side: Secondary | ICD-10-CM | POA: Diagnosis not present

## 2016-04-08 MED ORDER — KETOROLAC TROMETHAMINE 60 MG/2ML IM SOLN
60.0000 mg | Freq: Once | INTRAMUSCULAR | Status: AC
  Administered 2016-04-08: 60 mg via INTRAMUSCULAR

## 2016-04-08 MED ORDER — PREDNISONE 20 MG PO TABS: ORAL_TABLET | ORAL | 0 refills | Status: DC

## 2016-04-08 MED ORDER — CYCLOBENZAPRINE HCL 10 MG PO TABS: 10.0000 mg | ORAL_TABLET | Freq: Every day | ORAL | 0 refills | Status: DC

## 2016-04-08 NOTE — ED Notes (Signed)
Patient waited 15 minutes and no reactions were noted. Injection site is unremarkable. 

## 2016-04-08 NOTE — ED Provider Notes (Signed)
[facility]    CSN: [id number] Arrival date & time: 04/08/16  1402     History   Chief Complaint Chief Complaint  Patient presents with  . Back Pain    HPI Matthew Mercado is a 55 y.o. male.   The history is provided by the patient.  Back Pain  Location:  Lumbar spine Quality:  Aching Radiates to:  L posterior upper leg Pain severity:  Moderate Pain is:  Unable to specify Onset quality:  Sudden Duration:  2 days Timing:  Constant Progression:  Worsening Chronicity:  Recurrent Context comment:  Sneezing Relieved by:  Nothing Ineffective treatments:  Being still Associated symptoms: no abdominal pain, no abdominal swelling, no bladder incontinence, no bowel incontinence, no chest pain, no dysuria, no fever, no headaches, no leg pain, no numbness, no paresthesias, no pelvic pain, no perianal numbness, no tingling and no weakness   Risk factors: no hx of cancer, no hx of osteoporosis, no lack of exercise, no menopause, not obese, not pregnant, no recent surgery, no steroid use and no vascular disease     Past Medical History:  Diagnosis Date  . Cervical spine degeneration   . Depression     There are no active problems to display for this patient.   Past Surgical History:  Procedure Laterality Date  . CHOLECYSTECTOMY    . SPINE SURGERY     diskectomy and fusion  . TONSILLECTOMY         Home Medications    Prior to Admission medications   Medication Sig Start Date End Date Taking? Authorizing Provider  cyclobenzaprine (FLEXERIL) 10 MG tablet Take 1 tablet (10 mg total) by mouth at bedtime. 04/08/16   Norval Gable, MD  polyethylene glycol (GOLYTELY) 236 g solution Take 4,000 mLs by mouth once. Drink one 8 oz glass every 20 mins until stools are clear. 12/04/15   [name], MD  predniSONE (DELTASONE) 20 MG tablet 3 tabs po qd for 2 days, then 2 tabs po qd for 3 days, then 1 tab po qd for 3 days, then half a tab po qd for 2 days 04/08/16    Norval Gable, MD    Family History Family History  Problem Relation Age of Onset  . Cancer Mother   . Cancer Father     prostate  . Hypertension Father   . Sleep apnea Sister   . Hypertension Sister   . Drug abuse Sister     Social History Social History  Substance Use Topics  . Smoking status: Former Smoker    Types: Cigarettes    Quit date: 11/12/2010  . Smokeless tobacco: Never Used  . Alcohol use No     Allergies   Review of patient's allergies indicates no known allergies.   Review of Systems Review of Systems  Constitutional: Negative for fever.  Cardiovascular: Negative for chest pain.  Gastrointestinal: Negative for abdominal pain and bowel incontinence.  Genitourinary: Negative for bladder incontinence, dysuria and pelvic pain.  Musculoskeletal: Positive for back pain.  Neurological: Negative for tingling, weakness, numbness, headaches and paresthesias.     Physical Exam Triage Vital Signs ED Triage Vitals  Enc Vitals Group     BP 04/08/16 1435 (!) 144/95     Pulse Rate 04/08/16 1435 80     Resp 04/08/16 1435 16     Temp 04/08/16 1435 97.3 F (36.3 C)     Temp Source 04/08/16 1435 Tympanic     SpO2 04/08/16 1435 99 %  Weight 04/08/16 1433 200 lb (90.7 kg)     Height 04/08/16 1433 5\' 11"  (1.803 m)     Head Circumference --      Peak Flow --      Pain Score 04/08/16 1434 8     Pain Loc --      Pain Edu? --      Excl. in [place]? --    No data found.   Updated Vital Signs BP (!) 144/95 (BP Location: Right Arm)   Pulse 80   Temp 97.3 F (36.3 C) (Tympanic)   Resp 16   Ht 5\' 11"  (1.803 m)   Wt 200 lb (90.7 kg)   SpO2 99%   BMI 27.89 kg/m   Visual Acuity Right Eye Distance:   Left Eye Distance:   Bilateral Distance:    Right Eye Near:   Left Eye Near:    Bilateral Near:     Physical Exam  Constitutional: He appears well-developed and well-nourished. No distress.  Neck: Normal range of motion. Neck supple. No tracheal deviation  present.  Pulmonary/Chest: Effort normal. No stridor. No respiratory distress.  Musculoskeletal:       Cervical back: He exhibits normal range of motion, no tenderness, no bony tenderness, no swelling, no edema, no deformity, no laceration, no pain, no spasm and normal pulse.       Lumbar back: He exhibits tenderness (over the right lumbar paraspinous muscles and right buttock) and spasm. He exhibits normal range of motion, no bony tenderness, no swelling, no edema, no deformity, no laceration, no pain and normal pulse.  Neurological: He is alert. He has normal reflexes. He displays normal reflexes. He exhibits normal muscle tone. Coordination normal.  Skin: No rash noted. He is not diaphoretic.  Nursing note and vitals reviewed.    UC Treatments / Results  Labs (all labs ordered are listed, but only abnormal results are displayed) Labs Reviewed - No data to display  EKG  EKG Interpretation None       Radiology No results found.  Procedures Procedures (including critical care time)  Medications Ordered in UC Medications  ketorolac (TORADOL) injection 60 mg (60 mg Intramuscular Given 04/08/16 1526)     Initial Impression / Assessment and Plan / UC Course  I have reviewed the triage vital signs and the nursing notes.  Pertinent labs & imaging results that were available during my care of the patient were reviewed by me and considered in my medical decision making (see chart for details).  Clinical Course      Final Clinical Impressions(s) / UC Diagnoses   Final diagnoses:  Sciatica of right side    New Prescriptions New Prescriptions   CYCLOBENZAPRINE (FLEXERIL) 10 MG TABLET    Take 1 tablet (10 mg total) by mouth at bedtime.   PREDNISONE (DELTASONE) 20 MG TABLET    3 tabs po qd for 2 days, then 2 tabs po qd for 3 days, then 1 tab po qd for 3 days, then half a tab po qd for 2 days   1. diagnosis reviewed with patient 2. rx as per orders above; reviewed possible  side effects, interactions, risks and benefits  3. Recommend supportive treatment with heat/ice, stretching 4. Follow-up prn if symptoms worsen or don't improve   Norval Gable, MD 04/08/16 1547

## 2016-04-08 NOTE — ED Triage Notes (Signed)
Patient states that he sneezed yesterday and felt pain in his lower back and goes down his left leg.

## 2016-10-21 ENCOUNTER — Encounter: Payer: Self-pay | Admitting: *Deleted

## 2016-10-21 ENCOUNTER — Ambulatory Visit
Admission: EM | Admit: 2016-10-21 | Discharge: 2016-10-21 | Disposition: A | Discharge status: Home / Self Care | Payer: 59 | Attending: Family Medicine | Admitting: Family Medicine

## 2016-10-21 DIAGNOSIS — M545 Low back pain, unspecified: Secondary | ICD-10-CM

## 2016-10-21 DIAGNOSIS — M62830 Muscle spasm of back: Secondary | ICD-10-CM | POA: Diagnosis not present

## 2016-10-21 MED ORDER — PREDNISONE 10 MG (21) PO TBPK: ORAL_TABLET | Freq: Every day | ORAL | 0 refills | Status: DC

## 2016-10-21 MED ORDER — CYCLOBENZAPRINE HCL 10 MG PO TABS: 10.0000 mg | ORAL_TABLET | Freq: Two times a day (BID) | ORAL | 0 refills | Status: DC | PRN

## 2016-10-21 NOTE — ED Triage Notes (Signed)
Recurrent low back pain that this time began 2 weeks ago. Denies injury but states his work aggravates pain.

## 2016-10-21 NOTE — ED Provider Notes (Signed)
CSN: [id number]     Arrival date & time 10/21/16  1219 History   First MD Initiated Contact with Patient 10/21/16 1238     Chief Complaint  Patient presents with  . Back Pain   (Consider location/radiation/quality/duration/timing/severity/associated sxs/prior Treatment) Patient is a 56 year old male with past history as below who presents with complaint of back pain and stiffness for the last couple weeks. Patient states pain is off and on and described as dull aching muscle pain in the left lower back/left hip area. Patient also reports that with standing up after getting out of bed or standing up from sitting, that he feels crooked and takes a little bit of walking to straighten back out. Patient was seen in this clinic this past October for similar symptoms at this time denies the sciatic aspect. Patient denies any bowel or urinary incontinence. Patient denies any numbness or tingling. 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 Patient reports that his been taking ibuprofen for pain relief with mild improvement.  Patient is requesting a prednisone stepdown taper similar to what he got October that worked very well for him.      Past Medical History:  Diagnosis Date  . Cervical spine degeneration   . Depression    Past Surgical History:  Procedure Laterality Date  . CHOLECYSTECTOMY    . SPINE SURGERY     diskectomy and fusion  . TONSILLECTOMY     Family History  Problem Relation Age of Onset  . Cancer Mother   . Cancer Father     prostate  . Hypertension Father   . Sleep apnea Sister   . Hypertension Sister   . Drug abuse Sister    Social History  Substance Use Topics  . Smoking status: Former Smoker    Types: Cigarettes    Quit date: 11/12/2010  . Smokeless tobacco: Never Used  . Alcohol use No    Review of Systems  Constitutional: Negative for chills and fatigue.  HENT: Negative.   Eyes: Negative.    Respiratory: Negative.   Cardiovascular: Negative.   Musculoskeletal: Positive for back pain and gait problem.       As noted in history of present illness    Allergies  Patient has no known allergies.  Home Medications   Prior to Admission medications   Medication Sig Start Date End Date Taking? Authorizing Provider  cyclobenzaprine (FLEXERIL) 10 MG tablet Take 1 tablet (10 mg total) by mouth 2 (two) times daily as needed for muscle spasms. 10/21/16   Luvenia Redden, PA-C  polyethylene glycol (GOLYTELY) 236 g solution Take 4,000 mLs by mouth once. Drink one 8 oz glass every 20 mins until stools are clear. 12/04/15   [name], MD  predniSONE (STERAPRED UNI-PAK 21 TAB) 10 MG (21) TBPK tablet Take by mouth daily. Take 6 tabs by mouth daily  for 2 days, then 5 tabs for 2 days, then 4 tabs for 2 days, then 3 tabs for 2 days, 2 tabs for 2 days, then 1 tab by mouth daily for 2 days 10/21/16   Luvenia Redden, PA-C   Meds Ordered and Administered this Visit  Medications - No data to display  BP (!) 136/94 (BP Location: Left Arm)   Pulse 66   Temp 98.4 F (36.9 C) (Oral)   Resp 16   Ht 5\' 11"  (1.803 m)   Wt 195 lb (88.5 kg)   SpO2  99%   BMI 27.20 kg/m  No data found.   Physical Exam  Constitutional: He is oriented to person, place, and time. He appears well-developed and well-nourished. No distress.  HENT:  Head: Normocephalic and atraumatic.  Eyes: EOM are normal. Pupils are equal, round, and reactive to light.  Neck: Normal range of motion. Neck supple.  Cardiovascular: Normal rate.   Pulmonary/Chest: Effort normal.  Musculoskeletal:       Lumbar back: He exhibits decreased range of motion, tenderness and spasm. He exhibits no swelling and no edema.  Upon standing from a sitting position, patient spinal alignment is off with spine appearing to have a curve over to the patient's left side. Patient with difficulty raising his feet off the floor, left worse than right. Patient  with decreased range of motion with twisting at the hip, patient with about 30 rotation to the right and actually 50 to the left. Patient is also with the decreased flexion forward at the waist. Minimal tenderness to palpation. Distal sensation intact.   Neurological: He is alert and oriented to person, place, and time.  Skin: Skin is warm and dry.  Psychiatric: He has a normal mood and affect. His behavior is normal.    Urgent Care Course     Procedures   Labs Review Labs Reviewed - No data to display  Imaging Review No results found.    MDM   1. Muscle spasm of back   2. Left-sided low back pain without sciatica, unspecified chronicity    New Prescriptions   CYCLOBENZAPRINE (FLEXERIL) 10 MG TABLET    Take 1 tablet (10 mg total) by mouth 2 (two) times daily as needed for muscle spasms.   PREDNISONE (STERAPRED UNI-PAK 21 TAB) 10 MG (21) TBPK TABLET    Take by mouth daily. Take 6 tabs by mouth daily  for 2 days, then 5 tabs for 2 days, then 4 tabs for 2 days, then 3 tabs for 2 days, 2 tabs for 2 days, then 1 tab by mouth daily for 2 days   Patient with muscle spasms and and pain to the lower back/left hip region. Spasming does appear to pull the patient's status by malalignment with initial standing that patient reports improves as he walks a little bit. Patient also reports stiffness that lasts about 2 hours in the morning. Patient denies any sciatica or nerve impingement symptoms. Will prescribe a 6 day prednisone taper as well as Flexeril. Patient states this worked for him well in the past. Will recommend patient to follow-up with his primary provider as this seems to have been a recurrence of his previous back pain. Follow-up PCP codeine 12 physical therapy or referral to a specialist for evaluation of possible other modes of treatment. Patient possibly also greater during this clinic should his symptoms not improve or worsen. Patient verbalized understanding and is in agreement  with plan  Luvenia Redden, PA-C     Luvenia Redden, PA-C 10/21/16 [phone]

## 2016-10-21 NOTE — Discharge Instructions (Signed)
-  complete prednisone taper as directed -cyclobenzaprine twice a day as needed for muscle spasm. Do not drive or operate heavy machinery after taking medication. -back exercises and stretches as attached -recommend follow up with PCP as this back pain has been recurrent.

## 2017-08-10 ENCOUNTER — Encounter: Payer: Self-pay | Admitting: Family Medicine

## 2017-09-28 ENCOUNTER — Telehealth: Payer: Self-pay

## 2017-09-28 DIAGNOSIS — R7309 Other abnormal glucose: Secondary | ICD-10-CM

## 2017-09-28 DIAGNOSIS — Z0000 Encounter for general adult medical examination without abnormal findings: Secondary | ICD-10-CM

## 2017-09-28 DIAGNOSIS — Z1329 Encounter for screening for other suspected endocrine disorder: Secondary | ICD-10-CM

## 2017-09-28 DIAGNOSIS — Z13220 Encounter for screening for lipoid disorders: Secondary | ICD-10-CM

## 2017-09-28 DIAGNOSIS — Z125 Encounter for screening for malignant neoplasm of prostate: Secondary | ICD-10-CM

## 2017-09-28 DIAGNOSIS — R748 Abnormal levels of other serum enzymes: Secondary | ICD-10-CM

## 2017-09-28 NOTE — Telephone Encounter (Signed)
Attempted to reach again - no answer.   

## 2017-09-28 NOTE — Telephone Encounter (Signed)
Will call patient after 9 a.m. To notify.

## 2017-09-28 NOTE — Telephone Encounter (Signed)
Attempted to notify patient. Phone currently not in service. Will try again later.

## 2017-09-28 NOTE — Telephone Encounter (Signed)
Copied from [place] [id number]. Topic: General - Other >> Sep 27, 2017  4:06 PM Carolyn Stare wrote:  Pt would like to have his labs done before his physical since he is  not scheduled to come in till 3pm   4/2/419  He would like to know if he can stop by the office the morning of his appt and have labs drawn  >> Sep 28, 2017  7:54 AM Amada Kingfisher, CMA wrote: Faythe Ghee to stop by morning of. Will have future labs in.

## 2017-09-30 NOTE — Telephone Encounter (Signed)
Attempted to reach patient. Still no answer. Will close encounter for now.

## 2017-10-11 ENCOUNTER — Encounter: Payer: 59 | Admitting: Family Medicine

## 2017-10-20 ENCOUNTER — Telehealth: Payer: Self-pay | Admitting: Family Medicine

## 2017-10-20 ENCOUNTER — Ambulatory Visit (INDEPENDENT_AMBULATORY_CARE_PROVIDER_SITE_OTHER): Payer: 59 | Admitting: Family Medicine

## 2017-10-20 ENCOUNTER — Encounter: Payer: Self-pay | Admitting: Family Medicine

## 2017-10-20 VITALS — BP 139/86 | HR 72 | Ht 69.69 in | Wt 199.0 lb

## 2017-10-20 DIAGNOSIS — R7309 Other abnormal glucose: Secondary | ICD-10-CM | POA: Diagnosis not present

## 2017-10-20 DIAGNOSIS — Z0000 Encounter for general adult medical examination without abnormal findings: Secondary | ICD-10-CM | POA: Diagnosis not present

## 2017-10-20 DIAGNOSIS — Z125 Encounter for screening for malignant neoplasm of prostate: Secondary | ICD-10-CM

## 2017-10-20 DIAGNOSIS — R748 Abnormal levels of other serum enzymes: Secondary | ICD-10-CM

## 2017-10-20 DIAGNOSIS — Z1211 Encounter for screening for malignant neoplasm of colon: Secondary | ICD-10-CM | POA: Diagnosis not present

## 2017-10-20 DIAGNOSIS — Z1329 Encounter for screening for other suspected endocrine disorder: Secondary | ICD-10-CM

## 2017-10-20 DIAGNOSIS — M79605 Pain in left leg: Secondary | ICD-10-CM | POA: Diagnosis not present

## 2017-10-20 DIAGNOSIS — Z13220 Encounter for screening for lipoid disorders: Secondary | ICD-10-CM

## 2017-10-20 LAB — URINALYSIS, ROUTINE W REFLEX MICROSCOPIC
Bilirubin, UA: NEGATIVE
Glucose, UA: NEGATIVE
Ketones, UA: NEGATIVE
LEUKOCYTES UA: NEGATIVE
Nitrite, UA: NEGATIVE
PH UA: 5 (ref 5.0–7.5)
PROTEIN UA: NEGATIVE
RBC, UA: NEGATIVE
Specific Gravity, UA: 1.025 (ref 1.005–1.030)
Urobilinogen, Ur: 0.2 mg/dL (ref 0.2–1.0)

## 2017-10-20 MED ORDER — SILDENAFIL CITRATE 20 MG PO TABS: 20.0000 mg | ORAL_TABLET | Freq: Every day | ORAL | 12 refills | Status: DC | PRN

## 2017-10-20 NOTE — Progress Notes (Signed)
BP 139/86   Pulse 72   Ht 5' 9.69" (1.77 m)   Wt 199 lb (90.3 kg)   SpO2 98%   BMI 28.81 kg/m    Subjective:    Patient ID: Matthew Mercado, male    DOB: 1961/04/15, 57 y.o.   MRN: [id number]  HPI: [name] is a 57 y.o. male  Chief Complaint  Patient presents with  . Annual Exam  Patient all in all doing well has some left knee pain discomfort at the upper tibial plateau anterior area.  More in the insertion area of muscles comes on with some twisting bending no real locking or giving way. Patient also able to do physical exertion without problems does have some mild ED symptoms wants to consider trying something. Patient also with history of elevated glucose in the past concerned about hemoglobin A1c and prediabetes we will check hemoglobin A1c. Relevant past medical, surgical, family and social history reviewed and updated as indicated. Interim medical history since our last visit reviewed. Allergies and medications reviewed and updated.  Review of Systems  Constitutional: Negative.   HENT: Negative.   Eyes: Negative.   Respiratory: Negative.   Cardiovascular: Negative.   Gastrointestinal: Negative.   Endocrine: Negative.   Genitourinary: Negative.   Musculoskeletal: Negative.   Skin: Negative.   Allergic/Immunologic: Negative.   Neurological: Negative.   Hematological: Negative.   Psychiatric/Behavioral: Negative.     Per HPI unless specifically indicated above     Objective:    BP 139/86   Pulse 72   Ht 5' 9.69" (1.77 m)   Wt 199 lb (90.3 kg)   SpO2 98%   BMI 28.81 kg/m   Wt Readings from Last 3 Encounters:  10/20/17 199 lb (90.3 kg)  10/21/16 195 lb (88.5 kg)  04/08/16 200 lb (90.7 kg)    Physical Exam  Constitutional: He is oriented to person, place, and time. He appears well-developed and well-nourished.  HENT:  Head: Normocephalic and atraumatic.  Right Ear: External ear normal.  Left Ear: External ear normal.  Eyes: Pupils are  equal, round, and reactive to light. Conjunctivae and EOM are normal.  Neck: Normal range of motion. Neck supple.  Cardiovascular: Normal rate, regular rhythm, normal heart sounds and intact distal pulses.  Pulmonary/Chest: Effort normal and breath sounds normal.  Abdominal: Soft. Bowel sounds are normal. There is no splenomegaly or hepatomegaly.  Genitourinary: Rectum normal, prostate normal and penis normal.  Musculoskeletal: Normal range of motion.  Left medial proximal tibial area skin slightly reddish and exquisitely tender to the touch.  Knee exam normal. No increased pain with resistance to flexion or dorsiflexion.  Neurological: He is alert and oriented to person, place, and time. He has normal reflexes.  Skin: No rash noted. No erythema.  Psychiatric: He has a normal mood and affect. His behavior is normal. Judgment and thought content normal.    Results for orders placed or performed in visit on 11/12/15  Comprehensive metabolic panel  Result Value Ref Range   Glucose 94 65 - 99 mg/dL   BUN 11 6 - 24 mg/dL   Creatinine, Ser 1.09 0.76 - 1.27 mg/dL   GFR calc non Af Amer 77 >59 mL/min/1.73   GFR calc Af Amer 88 >59 mL/min/1.73   BUN/Creatinine Ratio 10 9 - 20   Sodium 148 (H) 134 - 144 mmol/L   Potassium 4.9 3.5 - 5.2 mmol/L   Chloride 105 96 - 106 mmol/L   CO2 26 18 -  29 mmol/L   Calcium 9.4 8.7 - 10.2 mg/dL   Total Protein 6.7 6.0 - 8.5 g/dL   Albumin 4.3 3.5 - 5.5 g/dL   Globulin, Total 2.4 1.5 - 4.5 g/dL   Albumin/Globulin Ratio 1.8 1.2 - 2.2   Bilirubin Total 0.7 0.0 - 1.2 mg/dL   Alkaline Phosphatase 84 39 - 117 IU/L   AST 68 (H) 0 - 40 IU/L   ALT 70 (H) 0 - 44 IU/L  Lipid panel  Result Value Ref Range   Cholesterol, Total 145 100 - 199 mg/dL   Triglycerides 77 0 - 149 mg/dL   HDL 55 >39 mg/dL   VLDL Cholesterol Cal 15 5 - 40 mg/dL   LDL Calculated 75 0 - 99 mg/dL   Chol/HDL Ratio 2.6 0.0 - 5.0 ratio units  CBC with Differential/Platelet  Result Value Ref  Range   WBC 7.5 3.4 - 10.8 x10E3/uL   RBC 5.43 4.14 - 5.80 x10E6/uL   Hemoglobin 17.6 12.6 - 17.7 g/dL   Hematocrit 50.5 37.5 - 51.0 %   MCV 93 79 - 97 fL   MCH 32.4 26.6 - 33.0 pg   MCHC 34.9 31.5 - 35.7 g/dL   RDW 13.4 12.3 - 15.4 %   Platelets 346 150 - 379 x10E3/uL   Neutrophils 57 %   Lymphs 23 %   Monocytes 7 %   Eos 12 %   Basos 1 %   Neutrophils Absolute 4.2 1.4 - 7.0 x10E3/uL   Lymphocytes Absolute 1.8 0.7 - 3.1 x10E3/uL   Monocytes Absolute 0.5 0.1 - 0.9 x10E3/uL   EOS (ABSOLUTE) 0.9 (H) 0.0 - 0.4 x10E3/uL   Basophils Absolute 0.1 0.0 - 0.2 x10E3/uL   Immature Granulocytes 0 %   Immature Grans (Abs) 0.0 0.0 - 0.1 x10E3/uL  TSH  Result Value Ref Range   TSH 1.320 0.450 - 4.500 uIU/mL  Urinalysis, Routine w reflex microscopic (not at [facility])  Result Value Ref Range   Specific Gravity, UA 1.020 1.005 - 1.030   pH, UA 5.5 5.0 - 7.5   Color, UA Yellow Yellow   Appearance Ur Clear Clear   Leukocytes, UA Negative Negative   Protein, UA Negative Negative/Trace   Glucose, UA Negative Negative   Ketones, UA Negative Negative   RBC, UA Negative Negative   Bilirubin, UA Negative Negative   Urobilinogen, Ur 1.0 0.2 - 1.0 mg/dL   Nitrite, UA Negative Negative  PSA  Result Value Ref Range   Prostate Specific Ag, Serum 0.7 0.0 - 4.0 ng/mL  Bayer DCA Hb A1c Waived  Result Value Ref Range   Bayer DCA Hb A1c Waived 5.1 <7.0 %      Assessment & Plan:   Problem List Items Addressed This Visit      Other   Elevated glucose    Will check hemoglobin A1c      Relevant Orders   Hgb A1c w/o eAG   Leg pain, anterior, left    Nonspecific exquisite left leg pain will refer to orthopedics to further evaluate.      Relevant Orders   Ambulatory referral to Orthopedic Surgery    Other Visit Diagnoses    Elevated liver enzymes    -  Primary   PE (physical exam), annual       Screening cholesterol level       Prostate cancer screening       Thyroid disorder screen        Colon cancer screening  Relevant Orders   Ambulatory referral to Gastroenterology       Follow up plan: Return in about 1 year (around 10/21/2018) for Physical Exam.

## 2017-10-20 NOTE — Assessment & Plan Note (Signed)
Nonspecific exquisite left leg pain will refer to orthopedics to further evaluate.

## 2017-10-20 NOTE — Telephone Encounter (Signed)
Copied from [place] [id number]. Topic: Quick Communication - See Telephone Encounter >> Oct 20, 2017 12:52 PM Cleaster Corin, NT wrote: CRM for notification. See Telephone encounter for: 10/20/17.  Pt. Calling asking can he come in @2pm  today to do labs for his 3pm appt. With Dr. Jeananne Rama. Pt would like a callback asap

## 2017-10-20 NOTE — Telephone Encounter (Signed)
Patient presented to clinic before I had a chance to call.

## 2017-10-20 NOTE — Assessment & Plan Note (Signed)
Will check hemoglobin A1c 

## 2017-10-21 ENCOUNTER — Encounter: Payer: Self-pay | Admitting: Family Medicine

## 2017-10-21 LAB — COMPREHENSIVE METABOLIC PANEL
A/G RATIO: 1.8 (ref 1.2–2.2)
ALBUMIN: 4.6 g/dL (ref 3.5–5.5)
ALT: 53 IU/L — AB (ref 0–44)
AST: 43 IU/L — ABNORMAL HIGH (ref 0–40)
Alkaline Phosphatase: 71 IU/L (ref 39–117)
BILIRUBIN TOTAL: 1.1 mg/dL (ref 0.0–1.2)
BUN / CREAT RATIO: 11 (ref 9–20)
BUN: 12 mg/dL (ref 6–24)
CHLORIDE: 101 mmol/L (ref 96–106)
CO2: 23 mmol/L (ref 20–29)
Calcium: 9.4 mg/dL (ref 8.7–10.2)
Creatinine, Ser: 1.09 mg/dL (ref 0.76–1.27)
GFR, EST AFRICAN AMERICAN: 87 mL/min/{1.73_m2} (ref >59)
GFR, EST NON AFRICAN AMERICAN: 75 mL/min/{1.73_m2} (ref >59)
Globulin, Total: 2.5 g/dL (ref 1.5–4.5)
Glucose: 82 mg/dL (ref 65–99)
Potassium: 4.2 mmol/L (ref 3.5–5.2)
Sodium: 143 mmol/L (ref 134–144)
TOTAL PROTEIN: 7.1 g/dL (ref 6.0–8.5)

## 2017-10-21 LAB — LIPID PANEL
CHOL/HDL RATIO: 2.4 ratio (ref 0.0–5.0)
Cholesterol, Total: 141 mg/dL (ref 100–199)
HDL: 58 mg/dL (ref >39)
LDL Calculated: 70 mg/dL (ref 0–99)
TRIGLYCERIDES: 63 mg/dL (ref 0–149)
VLDL Cholesterol Cal: 13 mg/dL (ref 5–40)

## 2017-10-21 LAB — CBC WITH DIFFERENTIAL/PLATELET
BASOS ABS: 0 10*3/uL (ref 0.0–0.2)
BASOS: 1 %
EOS (ABSOLUTE): 0.9 10*3/uL — ABNORMAL HIGH (ref 0.0–0.4)
Eos: 13 %
HEMOGLOBIN: 17.2 g/dL (ref 13.0–17.7)
Hematocrit: 49 % (ref 37.5–51.0)
IMMATURE GRANS (ABS): 0 10*3/uL (ref 0.0–0.1)
Immature Granulocytes: 0 %
LYMPHS ABS: 2.1 10*3/uL (ref 0.7–3.1)
LYMPHS: 29 %
MCH: 32.5 pg (ref 26.6–33.0)
MCHC: 35.1 g/dL (ref 31.5–35.7)
MCV: 93 fL (ref 79–97)
MONOCYTES: 9 %
Monocytes Absolute: 0.7 10*3/uL (ref 0.1–0.9)
NEUTROS ABS: 3.4 10*3/uL (ref 1.4–7.0)
Neutrophils: 48 %
Platelets: 224 10*3/uL (ref 150–379)
RBC: 5.29 x10E6/uL (ref 4.14–5.80)
RDW: 14 % (ref 12.3–15.4)
WBC: 7.1 10*3/uL (ref 3.4–10.8)

## 2017-10-21 LAB — TSH: TSH: 1.86 u[IU]/mL (ref 0.450–4.500)

## 2017-10-21 LAB — HGB A1C W/O EAG: Hgb A1c MFr Bld: 4.5 % — ABNORMAL LOW (ref 4.8–5.6)

## 2017-10-21 LAB — PSA: PROSTATE SPECIFIC AG, SERUM: 0.7 ng/mL (ref 0.0–4.0)

## 2017-11-09 ENCOUNTER — Other Ambulatory Visit: Payer: Self-pay

## 2017-11-09 DIAGNOSIS — Z1211 Encounter for screening for malignant neoplasm of colon: Secondary | ICD-10-CM

## 2017-12-08 ENCOUNTER — Ambulatory Visit
Admission: RE | Admit: 2017-12-08 | Discharge: 2017-12-08 | Disposition: A | Discharge status: Home / Self Care | Payer: 59 | Source: Ambulatory Visit | Attending: Gastroenterology | Admitting: Gastroenterology

## 2017-12-08 ENCOUNTER — Ambulatory Visit: Payer: 59 | Admitting: Anesthesiology

## 2017-12-08 ENCOUNTER — Encounter
Admission: RE | Disposition: A | Discharge status: Home / Self Care | Payer: Self-pay | Source: Ambulatory Visit | Attending: Gastroenterology

## 2017-12-08 DIAGNOSIS — D125 Benign neoplasm of sigmoid colon: Secondary | ICD-10-CM | POA: Insufficient documentation

## 2017-12-08 DIAGNOSIS — K640 First degree hemorrhoids: Secondary | ICD-10-CM | POA: Insufficient documentation

## 2017-12-08 DIAGNOSIS — K635 Polyp of colon: Secondary | ICD-10-CM

## 2017-12-08 DIAGNOSIS — Z87891 Personal history of nicotine dependence: Secondary | ICD-10-CM | POA: Diagnosis not present

## 2017-12-08 DIAGNOSIS — D124 Benign neoplasm of descending colon: Secondary | ICD-10-CM

## 2017-12-08 DIAGNOSIS — D126 Benign neoplasm of colon, unspecified: Secondary | ICD-10-CM | POA: Diagnosis not present

## 2017-12-08 DIAGNOSIS — Z1211 Encounter for screening for malignant neoplasm of colon: Secondary | ICD-10-CM

## 2017-12-08 HISTORY — PX: COLONOSCOPY WITH PROPOFOL: SHX5780

## 2017-12-08 SURGERY — COLONOSCOPY WITH PROPOFOL
Anesthesia: General | Wound class: Contaminated

## 2017-12-08 MED ORDER — PROPOFOL 10 MG/ML IV BOLUS
INTRAVENOUS | Status: DC | PRN
  Administered 2017-12-08: 40 mg via INTRAVENOUS
  Administered 2017-12-08: 120 mg via INTRAVENOUS
  Administered 2017-12-08: 20 mg via INTRAVENOUS
  Administered 2017-12-08: 30 mg via INTRAVENOUS
  Administered 2017-12-08: 40 mg via INTRAVENOUS

## 2017-12-08 MED ORDER — ACETAMINOPHEN 160 MG/5ML PO SOLN
325.0000 mg | Freq: Once | ORAL | Status: DC
Start: 2017-12-08 — End: 2017-12-08

## 2017-12-08 MED ORDER — SODIUM CHLORIDE 0.9 % IV SOLN: INTRAVENOUS | Status: DC

## 2017-12-08 MED ORDER — LIDOCAINE HCL (CARDIAC) PF 100 MG/5ML IV SOSY
PREFILLED_SYRINGE | INTRAVENOUS | Status: DC | PRN
  Administered 2017-12-08: 30 mg via INTRAVENOUS

## 2017-12-08 MED ORDER — LACTATED RINGERS IV SOLN
INTRAVENOUS | Status: DC
  Administered 2017-12-08: 11:00:00 via INTRAVENOUS

## 2017-12-08 MED ORDER — ACETAMINOPHEN 325 MG PO TABS: 325.0000 mg | ORAL_TABLET | Freq: Once | ORAL | Status: DC

## 2017-12-08 SURGICAL SUPPLY — 24 items
CANISTER SUCT 1200ML W/VALVE (MISCELLANEOUS) ×2 IMPLANT
CLIP HMST 235XBRD CATH ROT (MISCELLANEOUS) IMPLANT
CLIP RESOLUTION 360 11X235 (MISCELLANEOUS)
ELECT REM PT RETURN 9FT ADLT (ELECTROSURGICAL)
ELECTRODE REM PT RTRN 9FT ADLT (ELECTROSURGICAL) IMPLANT
FCP ESCP3.2XJMB 240X2.8X (MISCELLANEOUS)
FORCEPS BIOP RAD 4 LRG CAP 4 (CUTTING FORCEPS) IMPLANT
FORCEPS BIOP RJ4 240 W/NDL (MISCELLANEOUS)
FORCEPS ESCP3.2XJMB 240X2.8X (MISCELLANEOUS) IMPLANT
GOWN CVR UNV OPN BCK APRN NK (MISCELLANEOUS) ×2 IMPLANT
GOWN ISOL THUMB LOOP REG UNIV (MISCELLANEOUS) ×2
INJECTOR VARIJECT VIN23 (MISCELLANEOUS) IMPLANT
KIT DEFENDO VALVE AND CONN (KITS) IMPLANT
KIT ENDO PROCEDURE OLY (KITS) ×2 IMPLANT
MARKER SPOT ENDO TATTOO 5ML (MISCELLANEOUS) IMPLANT
PROBE APC STR FIRE (PROBE) IMPLANT
RETRIEVER NET ROTH 2.5X230 LF (MISCELLANEOUS) IMPLANT
SNARE SHORT THROW 13M SML OVAL (MISCELLANEOUS) ×2 IMPLANT
SNARE SHORT THROW 30M LRG OVAL (MISCELLANEOUS) IMPLANT
SNARE SNG USE RND 15MM (INSTRUMENTS) IMPLANT
SPOT EX ENDOSCOPIC TATTOO (MISCELLANEOUS)
TRAP ETRAP POLY (MISCELLANEOUS) ×2 IMPLANT
VARIJECT INJECTOR VIN23 (MISCELLANEOUS)
WATER STERILE IRR 250ML POUR (IV SOLUTION) ×2 IMPLANT

## 2017-12-08 NOTE — Anesthesia Procedure Notes (Signed)
Date/Time: 12/08/2017 10:54 AM Performed by: Cameron Ali, CRNA Pre-anesthesia Checklist: Patient identified, Emergency Drugs available, Suction available, Timeout performed and Patient being monitored Patient Re-evaluated:Patient Re-evaluated prior to induction Oxygen Delivery Method: Nasal cannula Placement Confirmation: positive ETCO2

## 2017-12-08 NOTE — Anesthesia Postprocedure Evaluation (Signed)
Anesthesia Post Note  Patient: Matthew Mercado  Procedure(s) Performed: COLONOSCOPY WITH PROPOFOL (N/A )  Patient location during evaluation: PACU Anesthesia Type: General Level of consciousness: awake and alert and oriented Pain management: satisfactory to patient Vital Signs Assessment: post-procedure vital signs reviewed and stable Respiratory status: spontaneous breathing, nonlabored ventilation and respiratory function stable Cardiovascular status: blood pressure returned to baseline and stable Postop Assessment: Adequate PO intake and No signs of nausea or vomiting Anesthetic complications: no    Raliegh Ip

## 2017-12-08 NOTE — Anesthesia Preprocedure Evaluation (Signed)
Anesthesia Evaluation  Patient identified by MRN, date of birth, ID band Patient awake    Reviewed: Allergy & Precautions, H&P , NPO status , Patient's Chart, lab work & pertinent test results  Airway Mallampati: II  TM Distance: >3 FB Neck ROM: full    Dental no notable dental hx.    Pulmonary former smoker,    Pulmonary exam normal breath sounds clear to auscultation       Cardiovascular Normal cardiovascular exam Rhythm:regular Rate:Normal     Neuro/Psych PSYCHIATRIC DISORDERS    GI/Hepatic   Endo/Other    Renal/GU      Musculoskeletal   Abdominal   Peds  Hematology   Anesthesia Other Findings   Reproductive/Obstetrics                             Anesthesia Physical Anesthesia Plan  ASA: II  Anesthesia Plan: General   Post-op Pain Management:    Induction: Intravenous  PONV Risk Score and Plan: 2 and Propofol infusion and Treatment may vary due to age or medical condition  Airway Management Planned: Natural Airway  Additional Equipment:   Intra-op Plan:   Post-operative Plan:   Informed Consent: I have reviewed the patients History and Physical, chart, labs and discussed the procedure including the risks, benefits and alternatives for the proposed anesthesia with the patient or authorized representative who has indicated his/her understanding and acceptance.     Plan Discussed with: CRNA  Anesthesia Plan Comments:         Anesthesia Quick Evaluation

## 2017-12-08 NOTE — Op Note (Signed)
[facility] Gastroenterology Patient Name: Matthew Mercado Procedure Date: 12/08/2017 10:52 AM MRN: [id number] Account #: [id number] Date of Birth: 1960/08/17 Admit Type: Outpatient Age: 57 Room: [facility] OR ROOM 01 Gender: Male Note Status: Finalized Procedure:            Colonoscopy Indications:          Screening for colorectal malignant neoplasm Providers:            [name] MD, MD Referring MD:         Guadalupe Maple, MD (Referring MD) Medicines:            Propofol per Anesthesia Complications:        No immediate complications. Procedure:            Pre-Anesthesia Assessment:                       - Prior to the procedure, a History and Physical was                        performed, and patient medications and allergies were                        reviewed. The patient's tolerance of previous                        anesthesia was also reviewed. The risks and benefits of                        the procedure and the sedation options and risks were                        discussed with the patient. All questions were                        answered, and informed consent was obtained. Prior                        Anticoagulants: The patient has taken no previous                        anticoagulant or antiplatelet agents. ASA Grade                        Assessment: II - A patient with mild systemic disease.                        After reviewing the risks and benefits, the patient was                        deemed in satisfactory condition to undergo the                        procedure.                       After obtaining informed consent, the colonoscope was                        passed under direct vision. Throughout the procedure,  the patient's blood pressure, pulse, and oxygen                        saturations were monitored continuously. The [place] [id number]) was introduced through  the                        anus and advanced to the the cecum, identified by                        appendiceal orifice and ileocecal valve. The                        colonoscopy was performed without difficulty. The                        patient tolerated the procedure well. The quality of                        the bowel preparation was excellent. Findings:      The perianal and digital rectal examinations were normal.      A 6 mm polyp was found in the descending colon. The polyp was sessile.       The polyp was removed with a cold snare. Resection and retrieval were       complete.      Two sessile polyps were found in the sigmoid colon. The polyps were 3 to       4 mm in size. These polyps were removed with a cold snare. Resection and       retrieval were complete.      Non-bleeding internal hemorrhoids were found during retroflexion. The       hemorrhoids were Grade I (internal hemorrhoids that do not prolapse). Impression:           - One 6 mm polyp in the descending colon, removed with                        a cold snare. Resected and retrieved.                       - Two 3 to 4 mm polyps in the sigmoid colon, removed                        with a cold snare. Resected and retrieved.                       - Non-bleeding internal hemorrhoids. Recommendation:       - Discharge patient to home.                       - Resume previous diet.                       - Continue present medications.                       - Await pathology results.                       -  Repeat colonoscopy in 5 years if polyp adenoma and 10                        years if hyperplastic Procedure Code(s):    --- Professional ---                       [phone], Colonoscopy, flexible; with removal of tumor(s),                        polyp(s), or other lesion(s) by snare technique Diagnosis Code(s):    --- Professional ---                       Z12.11, Encounter for screening for malignant neoplasm                         of colon                       D12.4, Benign neoplasm of descending colon                       D12.5, Benign neoplasm of sigmoid colon CPT copyright 2017 [employer]. All rights reserved. The codes documented in this report are preliminary and upon coder review may  be revised to meet current compliance requirements. [name] MD, MD 12/08/2017 11:11:08 AM This report has been signed electronically. Number of Addenda: 0 Note Initiated On: 12/08/2017 10:52 AM Scope Withdrawal Time: 0 hours 7 minutes 48 seconds  Total Procedure Duration: 0 hours 12 minutes 18 seconds       [facility]

## 2017-12-08 NOTE — Discharge Instructions (Signed)
General Anesthesia, Adult, Care After °These instructions provide you with information about caring for yourself after your procedure. Your health care provider may also give you more specific instructions. Your treatment has been planned according to current medical practices, but problems sometimes occur. Call your health care provider if you have any problems or questions after your procedure. °What can I expect after the procedure? °After the procedure, it is common to have: °· Vomiting. °· A sore throat. °· Mental slowness. ° °It is common to feel: °· Nauseous. °· Cold or shivery. °· Sleepy. °· Tired. °· Sore or achy, even in parts of your body where you did not have surgery. ° °Follow these instructions at home: °For at least 24 hours after the procedure: °· Do not: °? Participate in activities where you could fall or become injured. °? Drive. °? Use heavy machinery. °? Drink alcohol. °? Take sleeping pills or medicines that cause drowsiness. °? Make important decisions or sign legal documents. °? Take care of children on your own. °· Rest. °Eating and drinking °· If you vomit, drink water, juice, or soup when you can drink without vomiting. °· Drink enough fluid to keep your urine clear or pale yellow. °· Make sure you have little or no nausea before eating solid foods. °· Follow the diet recommended by your health care provider. °General instructions °· Have a responsible adult stay with you until you are awake and alert. °· Return to your normal activities as told by your health care provider. Ask your health care provider what activities are safe for you. °· Take over-the-counter and prescription medicines only as told by your health care provider. °· If you smoke, do not smoke without supervision. °· Keep all follow-up visits as told by your health care provider. This is important. °Contact a health care provider if: °· You continue to have nausea or vomiting at home, and medicines are not helpful. °· You  cannot drink fluids or start eating again. °· You cannot urinate after 8-12 hours. °· You develop a skin rash. °· You have fever. °· You have increasing redness at the site of your procedure. °Get help right away if: °· You have difficulty breathing. °· You have chest pain. °· You have unexpected bleeding. °· You feel that you are having a life-threatening or urgent problem. °This information is not intended to replace advice given to you by your health care provider. Make sure you discuss any questions you have with your health care provider. °Document Released: 09/21/2000 Document Revised: 11/18/2015 Document Reviewed: 05/30/2015 °Elsevier Interactive Patient Education © 2018 [employer] ° °

## 2017-12-08 NOTE — Transfer of Care (Signed)
Immediate Anesthesia Transfer of Care Note  Patient: Matthew Mercado  Procedure(s) Performed: COLONOSCOPY WITH PROPOFOL (N/A )  Patient Location: PACU  Anesthesia Type: General  Level of Consciousness: awake, alert  and patient cooperative  Airway and Oxygen Therapy: Patient Spontanous Breathing and Patient connected to supplemental oxygen  Post-op Assessment: Post-op Vital signs reviewed, Patient's Cardiovascular Status Stable, Respiratory Function Stable, Patent Airway and No signs of Nausea or vomiting  Post-op Vital Signs: Reviewed and stable  Complications: No apparent anesthesia complications

## 2017-12-08 NOTE — H&P (Signed)
[name], MD [name], [name] [address] Phone: [phone] Fax : [phone]  Primary Care Physician:  Guadalupe Maple, MD Primary Gastroenterologist:  Dr. Allen Norris  Pre-Procedure History & Physical: HPI:  Matthew Mercado is a 57 y.o. male is here for a screening colonoscopy.   Past Medical History:  Diagnosis Date  . Cervical spine degeneration   . Depression     Past Surgical History:  Procedure Laterality Date  . CHOLECYSTECTOMY    . SPINE SURGERY     discectomy and fusion C 4/5  . TONSILLECTOMY      Prior to Admission medications   Medication Sig Start Date End Date Taking? Authorizing Provider  sildenafil (REVATIO) 20 MG tablet Take 1 tablet (20 mg total) by mouth daily as needed. Patient not taking: Reported on 12/06/2017 10/20/17   Guadalupe Maple, MD    Allergies as of 11/09/2017  . (No Known Allergies)    Family History  Problem Relation Age of Onset  . Cancer Mother   . Cancer Father        prostate  . Hypertension Father   . Sleep apnea Sister   . Hypertension Sister   . Drug abuse Sister     Social History   Socioeconomic History  . Marital status: Married    Spouse name: Not on file  . Number of children: Not on file  . Years of education: Not on file  . Highest education level: Not on file  Occupational History  . Not on file  Social Needs  . Financial resource strain: Not on file  . Food insecurity:    Worry: Not on file    Inability: Not on file  . Transportation needs:    Medical: Not on file    Non-medical: Not on file  Tobacco Use  . Smoking status: Former Smoker    Types: Cigarettes    Last attempt to quit: 11/12/2010    Years since quitting: 7.0  . Smokeless tobacco: Never Used  Substance and Sexual Activity  . Alcohol use: No  . Drug use: No  . Sexual activity: Not on file  Lifestyle  . Physical activity:    Days per week: Not on file    Minutes per session: Not on file  . Stress:  Not on file  Relationships  . Social connections:    Talks on phone: Not on file    Gets together: Not on file    Attends religious service: Not on file    Active member of club or organization: Not on file    Attends meetings of clubs or organizations: Not on file    Relationship status: Not on file  . Intimate partner violence:    Fear of current or ex partner: Not on file    Emotionally abused: Not on file    Physically abused: Not on file    Forced sexual activity: Not on file  Other Topics Concern  . Not on file  Social History Narrative  . Not on file    Review of Systems: See HPI, otherwise negative ROS  Physical Exam: Ht 5\' 11"  (1.803 m)   Wt 190 lb (86.2 kg)   BMI 26.50 kg/m  General:   Alert,  pleasant and cooperative in NAD Head:  Normocephalic and atraumatic. Neck:  Supple; no masses or thyromegaly. Lungs:  Clear throughout to auscultation.    Heart:  Regular rate and rhythm. Abdomen:  Soft, nontender  and nondistended. Normal bowel sounds, without guarding, and without rebound.   Neurologic:  Alert and  oriented x4;  grossly normal neurologically.  Impression/Plan: Matthew Mercado is now here to undergo a screening colonoscopy.  Risks, benefits, and alternatives regarding colonoscopy have been reviewed with the patient.  Questions have been answered.  All parties agreeable.

## 2017-12-09 ENCOUNTER — Encounter: Payer: Self-pay | Admitting: Gastroenterology

## 2017-12-10 LAB — SURGICAL PATHOLOGY

## 2017-12-13 ENCOUNTER — Encounter: Payer: Self-pay | Admitting: Gastroenterology

## 2017-12-31 ENCOUNTER — Ambulatory Visit
Admission: EM | Admit: 2017-12-31 | Discharge: 2017-12-31 | Disposition: A | Discharge status: Home / Self Care | Payer: 59 | Attending: Family Medicine | Admitting: Family Medicine

## 2017-12-31 DIAGNOSIS — M7712 Lateral epicondylitis, left elbow: Secondary | ICD-10-CM | POA: Diagnosis not present

## 2017-12-31 MED ORDER — PREDNISONE 10 MG (21) PO TBPK: ORAL_TABLET | ORAL | 0 refills | Status: DC

## 2017-12-31 NOTE — ED Provider Notes (Signed)
[facility]    CSN: [id number] Arrival date & time: 12/31/17  0801  History   Chief Complaint Chief Complaint  Patient presents with  . Elbow Pain   HPI  57 year old male presents with left elbow pain.  Left elbow pain  Abrupt onset.  Has been present for the past week.  Constant.  Location: Lateral epicondyle.  Worse with squeezing, lifting.  Better with rest.  Up to 7/10 in severity at times.  He has taken Ibuprofen with improvement as well.  No swelling or other associated symptoms.  No other complaints.  Past Medical History:  Diagnosis Date  . Cervical spine degeneration   . Depression    Patient Active Problem List   Diagnosis Date Noted  . Encounter for screening colonoscopy   . Benign neoplasm of descending colon   . Polyp of sigmoid colon   . Elevated glucose 10/20/2017  . Leg pain, anterior, left 10/20/2017   Past Surgical History:  Procedure Laterality Date  . CHOLECYSTECTOMY    . COLONOSCOPY WITH PROPOFOL N/A 12/08/2017   Procedure: COLONOSCOPY WITH PROPOFOL;  Surgeon: [name], MD;  Location: [facility];  Service: Endoscopy;  Laterality: N/A;  . SPINE SURGERY     discectomy and fusion C 4/5  . TONSILLECTOMY      Home Medications    Prior to Admission medications   Medication Sig Start Date End Date Taking? Authorizing Provider  predniSONE (STERAPRED UNI-PAK 21 TAB) 10 MG (21) TBPK tablet 6 tabs daily for 2 days, then 5 tabs for 2 days, then 4 tabs for 2 days, then 3 tabs for 2 days, then 2 tabs for 2 days, then 1 tab for 2 days 12/31/17   Thersa Salt G, DO  sildenafil (REVATIO) 20 MG tablet Take 1 tablet (20 mg total) by mouth daily as needed. Patient not taking: Reported on 12/06/2017 10/20/17   Guadalupe Maple, MD    Family History Family History  Problem Relation Age of Onset  . Cancer Mother   . Cancer Father        prostate  . Hypertension Father   . Sleep apnea Sister   . Hypertension Sister   . Drug  abuse Sister     Social History Social History   Tobacco Use  . Smoking status: Former Smoker    Types: Cigarettes    Last attempt to quit: 11/12/2010    Years since quitting: 7.1  . Smokeless tobacco: Never Used  Substance Use Topics  . Alcohol use: No  . Drug use: No     Allergies   Patient has no known allergies.   Review of Systems Review of Systems  Constitutional: Negative.   Musculoskeletal:       Left elbow pain.   Physical Exam Triage Vital Signs ED Triage Vitals  Enc Vitals Group     BP 12/31/17 0808 134/89     Pulse Rate 12/31/17 0808 66     Resp 12/31/17 0808 16     Temp 12/31/17 0808 98 F (36.7 C)     Temp Source 12/31/17 0808 Oral     SpO2 12/31/17 0808 98 %     Weight 12/31/17 0809 190 lb (86.2 kg)     Height 12/31/17 0809 5\' 11"  (1.803 m)     Head Circumference --      Peak Flow --      Pain Score 12/31/17 0809 7     Pain Loc --  Pain Edu? --      Excl. in [place]? --    Updated Vital Signs BP 134/89 (BP Location: Right Arm)   Pulse 66   Temp 98 F (36.7 C) (Oral)   Resp 16   Ht 5\' 11"  (1.803 m)   Wt 190 lb (86.2 kg)   SpO2 98%   BMI 26.50 kg/m   Physical Exam  Constitutional: He is oriented to person, place, and time. He appears well-developed. No distress.  HENT:  Head: Normocephalic and atraumatic.  Pulmonary/Chest: Effort normal. No respiratory distress.  Musculoskeletal:  Elbow: Left  Unremarkable to inspection. Tenderness to palpation of the lateral epicondyle.   Neurological: He is alert and oriented to person, place, and time.  Skin: Skin is warm. No rash noted.  Psychiatric: He has a normal mood and affect. His behavior is normal.  Vitals reviewed.  UC Treatments / Results  Labs (all labs ordered are listed, but only abnormal results are displayed) Labs Reviewed - No data to display  EKG None  Radiology No results found.  Procedures Procedures (including critical care time)  Medications Ordered in  UC Medications - No data to display  Initial Impression / Assessment and Plan / UC Course  I have reviewed the triage vital signs and the nursing notes.  Pertinent labs & imaging results that were available during my care of the patient were reviewed by me and considered in my medical decision making (see chart for details).    57 year old male presents with lateral epicondylitis. Treating with steroid taper. Advised OTC brace.  Final Clinical Impressions(s) / UC Diagnoses   Final diagnoses:  Lateral epicondylitis of left elbow   Discharge Instructions   None    ED Prescriptions    Medication Sig Dispense Auth. Provider   predniSONE (STERAPRED UNI-PAK 21 TAB) 10 MG (21) TBPK tablet 6 tabs daily for 2 days, then 5 tabs for 2 days, then 4 tabs for 2 days, then 3 tabs for 2 days, then 2 tabs for 2 days, then 1 tab for 2 days 42 tablet Coral Spikes, DO     Controlled Substance Prescriptions [place] Controlled Substance Registry consulted? Not Applicable   Coral Spikes, [place] 12/31/17 3817

## 2018-05-04 ENCOUNTER — Encounter: Payer: Self-pay | Admitting: Family Medicine

## 2018-05-04 ENCOUNTER — Ambulatory Visit: Payer: 59 | Admitting: Family Medicine

## 2018-05-04 DIAGNOSIS — M7711 Lateral epicondylitis, right elbow: Secondary | ICD-10-CM | POA: Insufficient documentation

## 2018-05-04 MED ORDER — MELOXICAM 15 MG PO TABS: 15.0000 mg | ORAL_TABLET | Freq: Every day | ORAL | 3 refills | Status: DC

## 2018-05-04 NOTE — Progress Notes (Signed)
BP 122/76 (BP Location: Left Arm, Patient Position: Sitting, Cuff Size: Normal)   Pulse 66   Temp 98.3 F (36.8 C) (Oral)   Ht '5\' 10"'  (1.778 m)   Wt 197 lb 1.6 oz (89.4 kg)   SpO2 97%   BMI 28.28 kg/m    Subjective:    Patient ID: Matthew Mercado, male    DOB: 11/18/1960, 57 y.o.   MRN: [id number]  HPI: Matthew Mercado is a 56 y.o. male  Chief Complaint  Patient presents with  . Elbow Pain    Right arm, can't straighten arm. Ongoing 3 days.   Patient follow-up as had lateral epicondylitis of his left arm which is gotten better but now has moved to his right arm.  Patient took prednisone in the past which seems to help it out a great deal. 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  Relevant past medical, surgical, family and social history reviewed and updated as indicated. Interim medical history since our last visit reviewed. Allergies and medications reviewed and updated.  Review of Systems  Constitutional: Negative.   Respiratory: Negative.   Cardiovascular: Negative.     Per HPI unless specifically indicated above     Objective:    BP 122/76 (BP Location: Left Arm, Patient Position: Sitting, Cuff Size: Normal)   Pulse 66   Temp 98.3 F (36.8 C) (Oral)   Ht '5\' 10"'  (1.778 m)   Wt 197 lb 1.6 oz (89.4 kg)   SpO2 97%   BMI 28.28 kg/m   Wt Readings from Last 3 Encounters:  05/04/18 197 lb 1.6 oz (89.4 kg)  12/31/17 190 lb (86.2 kg)  12/08/17 190 lb (86.2 kg)    Physical Exam  Constitutional: He is oriented to person, place, and time. He appears well-developed and well-nourished.  HENT:  Head: Normocephalic and atraumatic.  Eyes: Conjunctivae and EOM are normal.  Neck: Normal range of motion.  Cardiovascular: Normal rate, regular rhythm and normal heart sounds.  Pulmonary/Chest: Effort normal and breath sounds normal.  Musculoskeletal: Normal range of motion.  Right lateral epicondyle tenderness with  resistance to motion  Neurological: He is alert and oriented to person, place, and time.  Skin: No erythema.  Psychiatric: He has a normal mood and affect. His behavior is normal. Judgment and thought content normal.    Results for orders placed or performed during the hospital encounter of 12/08/17  Surgical pathology  Result Value Ref Range   SURGICAL PATHOLOGY      Surgical Pathology CASE: [id number] PATIENT: Harland Sans Surgical Pathology Report     SPECIMEN SUBMITTED: A. Colon polyp, descending; cold snare B. Colon polyp x2, sigmoid; cold snare  CLINICAL HISTORY: None provided  PRE-OPERATIVE DIAGNOSIS: Screening colonoscopy  POST-OPERATIVE DIAGNOSIS: None provided.     DIAGNOSIS: A. COLON POLYP, DESCENDING; COLD SNARE: - SESSILE SERRATED ADENOMA. - NEGATIVE FOR HIGH-GRADE DYSPLASIA AND MALIGNANCY.  B.  COLON POLYP X2, SIGMOID; COLD SNARE: - SESSILE SERRATED ADENOMA (1). - COLONIC MUCOSA WITH PROMINENT LYMPHOID AGGREGATE (1). - NEGATIVE FOR HIGH-GRADE DYSPLASIA AND MALIGNANCY.  Comment: Sessile serrated adenomas (SSAs) are often difficult to detect endoscopically as they are typically broad flat lesions found in the proximal colon. By morphologic definition, SSAs demonstrate architectural (low grade) dysplasia, with or without concurrent cytologic dysplasia. SSAs are thought to be precursor lesions  to a subset of colonic adenocarcinomas that arise through the serrated neoplastic pathway rather than the classical neoplasia pathway. Lesions of  the serrated pathway have a high frequency of BRAF mutation and are more likely to be microsatellite unstable compared to tubular / villous adenomas of the classical pathway.  The recommended surveillance interval for a SSA <10 mm with no cytologic dysplasia is 5 years.  A SSA =10 mm and a sessile serrated polyp with cytological dysplasia should be managed like a high risk adenoma (recommended  surveillance interval of 3 years).  Serrated polyposis syndrome, as defined by WHO, should be considered with one of the following criteria: (1) at least 5 serrated polyps proximal to sigmoid, with 2 or more =10 mm; (2) any serrated polyps proximal to sigmoid with family history of serrated polyposis syndrome; and (3) >20 serrated polyps of any size throughout the colon.  References: Guidelines for Colonoscopy Surveillance After [place] reening and Polypectomy: A Consensus Update by the [place] Multi-Society Task Force on Colorectal Cancer. [place] [employer], 2012.  GROSS DESCRIPTION: A. Labeled: Descending colon polyp cold snare Received: In formalin Tissue fragment(s): 3 Size: 0.5 - 0.6 cm Description: Tan fragments Entirely submitted in one cassette.  B. Labeled: Sigmoid colon polyp x2 cold snare Received: In formalin Tissue fragment(s): 3 Size: 0.1-0.5 cm Description: Tan tissue fragments with a small amount of green fecal material Entirely submitted in one cassette.    Final Diagnosis performed by Quay Burow, MD.   Electronically signed 12/10/2017 11:27:56AM The electronic signature indicates that the named Attending Pathologist has evaluated the specimen  Technical component performed at [facility], [address], [place], [place] [address] Lab: [phone] Dir: Rush Farmer, MD, MMM  Professional component performed at [facility], [facility], [place], [employer], [place] [address] Lab: [phone] Dir: Dellia Nims. Rubinas, MD       Assessment & Plan:   Problem List Items Addressed This Visit      Musculoskeletal and Integument   Lateral epicondylitis, right elbow    Discussed care and treatment use of meloxicam moderating activity that caused it      Relevant Medications   meloxicam (MOBIC) 15 MG tablet       Follow up plan: Return for As scheduled.

## 2018-05-04 NOTE — Assessment & Plan Note (Signed)
Discussed care and treatment use of meloxicam moderating activity that caused it

## 2018-08-01 ENCOUNTER — Ambulatory Visit: Payer: 59 | Admitting: Family Medicine

## 2018-08-01 ENCOUNTER — Encounter: Payer: Self-pay | Admitting: Family Medicine

## 2018-08-01 VITALS — BP 142/95 | HR 108 | Temp 102.2°F | Ht 70.0 in | Wt 199.0 lb

## 2018-08-01 DIAGNOSIS — J101 Influenza due to other identified influenza virus with other respiratory manifestations: Secondary | ICD-10-CM

## 2018-08-01 DIAGNOSIS — R6889 Other general symptoms and signs: Secondary | ICD-10-CM | POA: Diagnosis not present

## 2018-08-01 LAB — VERITOR FLU A/B WAIVED
Influenza A: POSITIVE — AB
Influenza B: NEGATIVE

## 2018-08-01 MED ORDER — OSELTAMIVIR PHOSPHATE 75 MG PO CAPS: 75.0000 mg | ORAL_CAPSULE | Freq: Two times a day (BID) | ORAL | 0 refills | Status: DC

## 2018-08-01 NOTE — Assessment & Plan Note (Signed)
Discussed use of Tamiflu with the counter medications. Wife is getting treatment from walk-in

## 2018-08-01 NOTE — Progress Notes (Signed)
   BP (!) 142/95   Pulse (!) 108   Temp (!) 102.2 F (39 C) (Oral)   Ht 5\' 10"  (1.778 m)   Wt 199 lb (90.3 kg)   SpO2 93%   BMI 28.55 kg/m    Subjective:    Patient ID: Matthew Mercado, male    DOB: June 20, 1961, 58 y.o.   MRN: [id number]  HPI: Matthew Mercado is a 58 y.o. male  Chief Complaint  Patient presents with  . Generalized Body Aches    Started yesterday morning, by 2 p.m. pt had 102.7 fever.   . Cough  . Sore Throat  Patient with moderate flu symptoms just started yesterday and suddenly having marked systemic symptoms of fever chills aches pains cough and sore throat.  Taking Tylenol and some over-the-counter cold medication.  Relevant past medical, surgical, family and social history reviewed and updated as indicated. Interim medical history since our last visit reviewed. Allergies and medications reviewed and updated.  Review of Systems  Constitutional: Positive for activity change, appetite change, chills, diaphoresis, fatigue and fever.  HENT: Positive for congestion.   Respiratory: Positive for cough.   Cardiovascular: Negative.     Per HPI unless specifically indicated above     Objective:    BP (!) 142/95   Pulse (!) 108   Temp (!) 102.2 F (39 C) (Oral)   Ht 5\' 10"  (1.778 m)   Wt 199 lb (90.3 kg)   SpO2 93%   BMI 28.55 kg/m   Wt Readings from Last 3 Encounters:  08/01/18 199 lb (90.3 kg)  05/04/18 197 lb 1.6 oz (89.4 kg)  12/31/17 190 lb (86.2 kg)    Physical Exam Constitutional:      Appearance: He is well-developed.  HENT:     Head: Normocephalic and atraumatic.     Right Ear: Tympanic membrane normal.     Left Ear: Tympanic membrane normal.  Eyes:     Conjunctiva/sclera: Conjunctivae normal.  Neck:     Musculoskeletal: Normal range of motion.  Cardiovascular:     Rate and Rhythm: Normal rate and regular rhythm.     Heart sounds: Normal heart sounds.  Pulmonary:     Effort: Pulmonary effort is normal.     Breath sounds:  Normal breath sounds.  Musculoskeletal: Normal range of motion.  Skin:    Findings: No erythema.  Neurological:     Mental Status: He is alert and oriented to person, place, and time.  Psychiatric:        Behavior: Behavior normal.        Thought Content: Thought content normal.        Judgment: Judgment normal.         Assessment & Plan:   Problem List Items Addressed This Visit      Respiratory   Influenza A    Discussed use of Tamiflu with the counter medications. Wife is getting treatment from walk-in      Relevant Medications   oseltamivir (TAMIFLU) 75 MG capsule    Other Visit Diagnoses    Flu-like symptoms    -  Primary   Relevant Orders   Veritor Flu A/B Waived       Follow up plan: Return if symptoms worsen or fail to improve, for As scheduled.

## 2018-10-26 ENCOUNTER — Encounter: Payer: 59 | Admitting: Family Medicine

## 2018-11-24 ENCOUNTER — Other Ambulatory Visit: Payer: Self-pay

## 2018-11-24 ENCOUNTER — Ambulatory Visit
Admission: EM | Admit: 2018-11-24 | Discharge: 2018-11-24 | Disposition: A | Discharge status: Home / Self Care | Payer: 59 | Attending: Urgent Care | Admitting: Urgent Care

## 2018-11-24 DIAGNOSIS — L03116 Cellulitis of left lower limb: Secondary | ICD-10-CM | POA: Diagnosis not present

## 2018-11-24 DIAGNOSIS — W2209XA Striking against other stationary object, initial encounter: Secondary | ICD-10-CM | POA: Diagnosis not present

## 2018-11-24 MED ORDER — DOXYCYCLINE HYCLATE 100 MG PO CAPS: 100.0000 mg | ORAL_CAPSULE | Freq: Two times a day (BID) | ORAL | 0 refills | Status: AC

## 2018-11-24 NOTE — Discharge Instructions (Addendum)
It was very nice meeting you today in clinic. Thank you for entrusting me with your care.   Please utilize the medications that we discussed. Your prescriptions have been called in to your pharmacy. Monitor for increased redness, bruising, swelling, fever.   Make arrangements to follow up with your regular doctor in 1 week for re-evaluation. If your symptoms/condition worsens, please seek follow up care either here or in the ER. Please remember, our [place] providers are "right here with you" when you need [place].   Again, it was my pleasure to take care of you today. Thank you for choosing our clinic. I hope that you start to feel better quickly.   Honor Loh, MSN, APRN, FNP-C, CEN Advanced Practice Provider [facility]

## 2018-11-24 NOTE — ED Provider Notes (Signed)
[address] [place], [place] [address] [phone]   Name: Matthew Mercado DOB: April 07, 1961 MRN: [id number] CSN: [id number] PCP: Guadalupe Maple, MD  Arrival date and time:  11/24/18 1241  Chief Complaint:  Leg Pain  NOTE: Prior to seeing the patient today, I have reviewed the triage nursing documentation and vital signs. Clinical staff has updated patient's PMH/PSHx, current medication list, and drug allergies/intolerances to ensure comprehensive history available to assist in medical decision making.   History:   HPI: Matthew Mercado is a 58 y.o. male who presents today with complaints of pain and swelling to his LEFT proximal tib/fib area.  Patient notes that he struck his leg on a piece of dirty metal approximately 2 weeks ago causing an area, skin integrity to the aforementioned area.  Patient notes that pain and swelling has been persistent of the course of last 2 weeks, however has improved overall.  In the last few days, patient notes increased pain and redness distally.  Yesterday, patient developed some swelling to the medial aspect of his LEFT ankle.  Area warm to touch.  No associated fevers.  Tetanus vaccination status up-to-date per patient report.  Past Medical History:  Diagnosis Date  . Cervical spine degeneration   . Depression     Past Surgical History:  Procedure Laterality Date  . CHOLECYSTECTOMY    . COLONOSCOPY WITH PROPOFOL N/A 12/08/2017   Procedure: COLONOSCOPY WITH PROPOFOL;  Surgeon: [name], MD;  Location: [facility];  Service: Endoscopy;  Laterality: N/A;  . SPINE SURGERY     discectomy and fusion C 4/5  . TONSILLECTOMY      Family History  Problem Relation Age of Onset  . Cancer Mother   . Cancer Father        prostate  . Hypertension Father   . Sleep apnea Sister   . Hypertension Sister   . Drug abuse Sister     Social History   Socioeconomic History  . Marital status: Married    Spouse name: Not on  file  . Number of children: Not on file  . Years of education: Not on file  . Highest education level: Not on file  Occupational History  . Not on file  Social Needs  . Financial resource strain: Not on file  . Food insecurity:    Worry: Not on file    Inability: Not on file  . Transportation needs:    Medical: Not on file    Non-medical: Not on file  Tobacco Use  . Smoking status: Former Smoker    Types: Cigarettes    Last attempt to quit: 11/12/2010    Years since quitting: 8.0  . Smokeless tobacco: Never Used  Substance and Sexual Activity  . Alcohol use: Yes    Comment: occasionally  . Drug use: No  . Sexual activity: Not on file  Lifestyle  . Physical activity:    Days per week: Not on file    Minutes per session: Not on file  . Stress: Not on file  Relationships  . Social connections:    Talks on phone: Not on file    Gets together: Not on file    Attends religious service: Not on file    Active member of club or organization: Not on file    Attends meetings of clubs or organizations: Not on file    Relationship status: Not on file  . Intimate partner violence:    Fear of current  or ex partner: Not on file    Emotionally abused: Not on file    Physically abused: Not on file    Forced sexual activity: Not on file  Other Topics Concern  . Not on file  Social History Narrative  . Not on file    Patient Active Problem List   Diagnosis Date Noted  . Influenza A 08/01/2018  . Lateral epicondylitis, right elbow 05/04/2018  . Encounter for screening colonoscopy   . Benign neoplasm of descending colon   . Polyp of sigmoid colon   . Elevated glucose 10/20/2017  . Leg pain, anterior, left 10/20/2017    Home Medications:    Current Meds  Medication Sig  . meloxicam (MOBIC) 15 MG tablet Take 1 tablet (15 mg total) by mouth daily.    Allergies:   Patient has no known allergies.  Review of Systems (ROS): Review of Systems  Constitutional: Negative for  chills and fever.  Respiratory: Negative for cough and shortness of breath.   Cardiovascular: Negative for chest pain and palpitations.  Gastrointestinal: Negative for nausea and vomiting.  Musculoskeletal:       Hematoma to LEFT shin. LEFT distal tib/fib tenderness, redness, and warmth.  Skin: Positive for color change and wound.     Physical Exam:  Triage Vital Signs ED Triage Vitals [11/24/18 1311]  Enc Vitals Group     BP      Pulse      Resp      Temp      Temp src      SpO2      Weight 199 lb (90.3 kg)     Height 5\' 11"  (1.803 m)     Head Circumference      Peak Flow      Pain Score 0     Pain Loc      Pain Edu?      Excl. in [place]?     Physical Exam  Constitutional: He is oriented to person, place, and time and well-developed, well-nourished, and in no distress.  HENT:  Head: Normocephalic and atraumatic.  Mouth/Throat: Mucous membranes are normal.  Cardiovascular: Normal rate, regular rhythm, normal heart sounds and intact distal pulses. Exam reveals no gallop and no friction rub.  No murmur heard. Pulmonary/Chest: Effort normal and breath sounds normal. No respiratory distress. He has no wheezes. He has no rales.  Musculoskeletal:     Left lower leg: He exhibits tenderness and swelling.       Legs:     Left foot: Tenderness (increased erythema and warmth) and swelling present.       Feet:     Comments: Post-traumatic hematoma to LEFT proximal tib/fib x 2 weeks  Neurological: He is alert and oriented to person, place, and time.  Skin: Skin is warm and dry. No rash noted. No erythema.  Psychiatric: Mood, affect and judgment normal.  Nursing note and vitals reviewed.    Urgent Care Treatments / Results:   LABS: PLEASE NOTE: all labs that were ordered this encounter are listed, however only abnormal results are displayed. Labs Reviewed - No data to display  EKG: -None  RADIOLOGY: No results found.  PRODEDURES: Procedures  MEDICATIONS RECEIVED THIS  VISIT: Medications - No data to display  PERTINENT CLINICAL COURSE NOTES/UPDATES: No data to display   Initial Impression / Assessment and Plan / Urgent Care Course:    Matthew Mercado is a 58 y.o. male who presents to [facility] today  with complaints of Leg Pain  Pertinent labs & imaging results that were available during my care of the patient were personally reviewed by me and considered in my medical decision making (see lab/imaging section of note for values and interpretations).  Patient sustained injury x2 weeks ago to LEFT proximal tib/fib area whereby he injured his leg on a piece of dirty metal.  Hematoma has reduced in size over the course of the last 2 weeks, however patient notes pain to his distal lower extremity, mainly over the medial ankle.  There is associated erythema, slight swelling, and warmth noted.  Presenting symptoms and exam concerning for cellulitis or possible joint injection associated with previous injury.  Will cover for atypical pathogens using a 7-day course of doxycycline.  Patient to monitor area for increased redness, lymphangitic spread, drainage, increased warmth, and the development of systemic fever.  Patient may use Tylenol and/or ibuprofen as needed for pain.  Discussed follow up with primary care physician in 1 week for re-evaluation. I have reviewed the follow up and strict return precautions for any new or worsening symptoms. Patient is aware of symptoms that would be deemed urgent/emergent, and would thus require further evaluation either here or in the emergency department. At the time of discharge, he verbalized understanding and consent with the discharge plan as it was reviewed with him. All questions were fielded by provider and/or clinic staff prior to patient discharge.    Final Clinical Impressions(s) / Urgent Care Diagnoses:   Final diagnoses:  Cellulitis of left lower extremity    New Prescriptions:   Meds ordered this  encounter  Medications  . doxycycline (VIBRAMYCIN) 100 MG capsule    Sig: Take 1 capsule (100 mg total) by mouth 2 (two) times daily for 7 days.    Dispense:  14 capsule    Refill:  0    Controlled Substance Prescriptions:  [place] Controlled Substance Registry consulted? Not Applicable  NOTE: This note was prepared using Dragon dictation software along with smaller phrase technology. Despite my best ability to proofread, there is the potential that transcriptional errors may still occur from this process, and are completely unintentional.     Karen Kitchens, NP 11/24/18 1503

## 2018-11-24 NOTE — ED Triage Notes (Signed)
Patient complains of left leg pain after hitting leg on a piece of metal 2 weeks ago. Patient has a raised area on shin and has noticed that bruising is settling down in to ankle and foot. Patient states that he has been having left foot pain.

## 2018-11-29 ENCOUNTER — Ambulatory Visit: Payer: 59 | Admitting: Family Medicine

## 2018-12-22 ENCOUNTER — Other Ambulatory Visit: Payer: Self-pay

## 2018-12-22 ENCOUNTER — Ambulatory Visit: Payer: Self-pay | Admitting: *Deleted

## 2018-12-22 ENCOUNTER — Telehealth: Payer: Self-pay | Admitting: *Deleted

## 2018-12-22 ENCOUNTER — Encounter: Payer: Self-pay | Admitting: Family Medicine

## 2018-12-22 ENCOUNTER — Ambulatory Visit (INDEPENDENT_AMBULATORY_CARE_PROVIDER_SITE_OTHER): Payer: 59 | Admitting: Family Medicine

## 2018-12-22 VITALS — Temp 97.6°F | Ht 71.0 in | Wt 195.0 lb

## 2018-12-22 DIAGNOSIS — R6889 Other general symptoms and signs: Secondary | ICD-10-CM | POA: Diagnosis not present

## 2018-12-22 DIAGNOSIS — Z20828 Contact with and (suspected) exposure to other viral communicable diseases: Secondary | ICD-10-CM

## 2018-12-22 DIAGNOSIS — Z20822 Contact with and (suspected) exposure to covid-19: Secondary | ICD-10-CM

## 2018-12-22 NOTE — Telephone Encounter (Signed)
Scheduled patient for appointment for COVID 19 test at [facility] tomorrow at 8 am.  Testing protocol reviewed with patient.

## 2018-12-22 NOTE — Telephone Encounter (Signed)
-----   Message from Volney American, [place] sent at 12/22/2018  3:15 PM EDT ----- Regarding: COVID testing Patient with 2 days of cough, congestion, sore throat. Employer requesting testing  [facility]

## 2018-12-22 NOTE — Telephone Encounter (Signed)
Pt called stating that he has a cough, scratchy throat, and a lump in his throat; his temp is 96.4; the pt says that his cough is productive with yellow-green sputum; his symptoms started on 12/21/2018; the pt says that he intermittently wears a mask at  Work; the pt states that he feels like this is the usual summer cold that he gets; he states that his employer told him that he needs to discuss the guidelines for COVID testing; recommendations made per nurse triage protocol; he verbalized understanding; pt transferred to [place] at [facility] for scheduling.   Reason for Disposition . [1] COVID-19 infection suspected by caller or triager AND [2] mild symptoms (cough, fever, or others) AND [9] no complications or SOB  Answer Assessment - Initial Assessment Questions 1. COVID-19 DIAGNOSIS: "Who made your Coronavirus (COVID-19) diagnosis?" "Was it confirmed by a positive lab test?" If not diagnosed by a HCP, ask "Are there lots of cases (community spread) where you live?" (See public health department website, if unsure)     Community spread 2. ONSET: "When did the COVID-19 symptoms start?"      12/21/2018 3. WORST SYMPTOM: "What is your worst symptom?" (e.g., cough, fever, shortness of breath, muscle aches)    cough 4. COUGH: "Do you have a cough?" If so, ask: "How bad is the cough?"       mild 5. FEVER: "Do you have a fever?" If so, ask: "What is your temperature, how was it measured, and when did it start?"     no 6. RESPIRATORY STATUS: "Describe your breathing?" (e.g., shortness of breath, wheezing, unable to speak)      no 7. BETTER-SAME-WORSE: "Are you getting better, staying the same or getting worse compared to yesterday?"  If getting worse, ask, "In what way?"     same 8. HIGH RISK DISEASE: "Do you have any chronic medical problems?" (e.g., asthma, heart or lung disease, weak immune system, etc.)     no 9. PREGNANCY: "Is there any chance you are pregnant?" "When was your  last menstrual period?"     n/a 10. OTHER SYMPTOMS: "Do you have any other symptoms?"  (e.g., chills, fatigue, headache, loss of smell or taste, muscle pain, sore throat)      scratchy throat  Protocols used: CORONAVIRUS (COVID-19) DIAGNOSED OR SUSPECTED-A-AH

## 2018-12-22 NOTE — Progress Notes (Signed)
   Temp 97.6 F (36.4 C) (Oral)   Ht 5\' 11"  (1.803 m)   Wt 195 lb (88.5 kg)   BMI 27.20 kg/m    Subjective:    Patient ID: Matthew Mercado, male    DOB: 05-03-1961, 58 y.o.   MRN: [id number]  HPI: [name] is a 58 y.o. male  Chief Complaint  Patient presents with  . Sore Throat    Ongoing 2 days. Employer wants patient to be tested for [place].   Marland Kitchen Cough    Productive cough, green/yellowish phlegm.  . Sneezing    . This visit was completed via telephone due to the restrictions of the COVID-19 pandemic. All issues as above were discussed and addressed. Physical exam was done as above through visual confirmation on telephone. If it was felt that the patient should be evaluated in the office, they were directed there. The patient verbally consented to this visit. . Location of the patient: home . Location of the provider: work . Those involved with this call:  . Provider: Merrie Roof, PA-C . CMA: Merilyn Baba, [place] . Front Desk/Registration: Jill Side  . Time spent on call: 15 minutes with patient face to face via video conference. More than 50% of this time was spent in counseling and coordination of care. 5 minutes total spent in review of patient's record and preparation of their chart. I verified patient identity using two factors (patient name and date of birth). Patient consents verbally to being seen via telemedicine visit today.   2 days of congestion, productive cough, and sore throat. Denies fevers, chills, CP, SOB, N/V/D. Employer requesting COVID 19 testing prior to returning to work. No definitive sick contacts known, no recent travel. Not taking anything for sxs at this time.   Relevant past medical, surgical, family and social history reviewed and updated as indicated. Interim medical history since our last visit reviewed. Allergies and medications reviewed and updated.  Review of Systems  Per HPI unless specifically indicated above      Objective:    Temp 97.6 F (36.4 C) (Oral)   Ht 5\' 11"  (1.803 m)   Wt 195 lb (88.5 kg)   BMI 27.20 kg/m   Wt Readings from Last 3 Encounters:  12/22/18 195 lb (88.5 kg)  11/24/18 199 lb (90.3 kg)  08/01/18 199 lb (90.3 kg)    Physical Exam  Unable to perform PE due to technical difficulties  Results for orders placed or performed in visit on 08/01/18  Veritor Flu A/B Waived  Result Value Ref Range   Influenza A Positive (A) Negative   Influenza B Negative Negative      Assessment & Plan:   Problem List Items Addressed This Visit    None    Visit Diagnoses    Suspected Covid-19 Virus Infection    -  Primary   Will refer for testing, supportive care and strict return precautions reviewed. Await test results, quarantine in meantime       Follow up plan: Return if symptoms worsen or fail to improve.

## 2018-12-23 ENCOUNTER — Other Ambulatory Visit: Payer: 59

## 2018-12-23 DIAGNOSIS — R6889 Other general symptoms and signs: Secondary | ICD-10-CM | POA: Diagnosis not present

## 2018-12-23 DIAGNOSIS — Z20822 Contact with and (suspected) exposure to covid-19: Secondary | ICD-10-CM

## 2018-12-27 ENCOUNTER — Encounter: Payer: Self-pay | Admitting: Family Medicine

## 2018-12-27 ENCOUNTER — Telehealth: Payer: Self-pay | Admitting: Family Medicine

## 2018-12-27 NOTE — Telephone Encounter (Signed)
Copied from [place] [phone]. Topic: General - Other >> Dec 26, 2018 11:28 AM Oneta Rack wrote: Patient inquiring about COVID results, please advise

## 2018-12-30 LAB — NOVEL CORONAVIRUS, NAA: SARS-CoV-2, NAA: NOT DETECTED

## 2019-03-01 ENCOUNTER — Encounter: Payer: Self-pay | Admitting: Family Medicine

## 2019-03-01 ENCOUNTER — Telehealth: Payer: Self-pay | Admitting: Family Medicine

## 2019-03-01 NOTE — Telephone Encounter (Signed)
meloxicam (MOBIC) 15 MG tablet  Special request from pt wife, pt wants to

## 2019-03-09 NOTE — Telephone Encounter (Signed)
Pt calling back to check status of medication refill. Please advise.

## 2019-03-09 NOTE — Telephone Encounter (Signed)
This is incomplete and doesn't make sense?

## 2019-03-10 MED ORDER — MELOXICAM 15 MG PO TABS: 15.0000 mg | ORAL_TABLET | Freq: Every day | ORAL | 3 refills | Status: DC

## 2019-03-10 NOTE — Telephone Encounter (Signed)
Patient would like a refill

## 2019-06-29 ENCOUNTER — Encounter: Payer: Self-pay | Admitting: Family Medicine

## 2019-06-29 ENCOUNTER — Ambulatory Visit (INDEPENDENT_AMBULATORY_CARE_PROVIDER_SITE_OTHER): Payer: 59 | Admitting: Family Medicine

## 2019-06-29 ENCOUNTER — Other Ambulatory Visit: Payer: Self-pay

## 2019-06-29 VITALS — Temp 97.2°F | Ht 70.0 in | Wt 199.0 lb

## 2019-06-29 DIAGNOSIS — J0100 Acute maxillary sinusitis, unspecified: Secondary | ICD-10-CM

## 2019-06-29 MED ORDER — AMOXICILLIN-POT CLAVULANATE 875-125 MG PO TABS: 1.0000 | ORAL_TABLET | Freq: Two times a day (BID) | ORAL | 0 refills | Status: DC

## 2019-06-29 NOTE — Progress Notes (Signed)
   Temp (!) 97.2 F (36.2 C) (Temporal)   Ht 5\' 10"  (1.778 m)   Wt 199 lb (90.3 kg)   BMI 28.55 kg/m    Subjective:    Patient ID: Matthew Mercado, male    DOB: [birth year], 58 y.o.   MRN: [id number]  HPI: Matthew Mercado is a 58 y.o. male  Chief Complaint  Patient presents with  . Cough    productive, green thick mucus coming up x 4 days  . Nasal Congestion    . This visit was completed via telephone due to the restrictions of the COVID-19 pandemic. All issues as above were discussed and addressed. Physical exam was done as above through visual confirmation on telephone. If it was felt that the patient should be evaluated in the office, they were directed there. The patient verbally consented to this visit. . Location of the patient: home . Location of the provider: work . Those involved with this call:  . Provider: Merrie Roof, PA-C . CMA: Lesle Chris, [place] . Front Desk/Registration: Jill Side  . Time spent on call: 15 minutes with patient face to face via video conference. More than 50% of this time was spent in counseling and coordination of care. 5 minutes total spent in review of patient's record and preparation of their chart. I verified patient identity using two factors (patient name and date of birth). Patient consents verbally to being seen via telemedicine visit today.   4 days ago started with chest tightness, nasal congestion, sinus pressure and headaches, productive cough of brown sputum. Taking alka seltzer cold and sinus with mild temporary relief but sxs now worsening. COVID negative, got test results back this morning. Denies sick contacts, recent travel, fever, chills, CP, SOB, N/V/D.    Relevant past medical, surgical, family and social history reviewed and updated as indicated. Interim medical history since our last visit reviewed. Allergies and medications reviewed and updated.  Review of Systems  Per HPI unless specifically indicated above       Objective:    Temp (!) 97.2 F (36.2 C) (Temporal)   Ht 5\' 10"  (1.778 m)   Wt 199 lb (90.3 kg)   BMI 28.55 kg/m   Wt Readings from Last 3 Encounters:  06/29/19 199 lb (90.3 kg)  12/22/18 195 lb (88.5 kg)  11/24/18 199 lb (90.3 kg)    Physical Exam  Unable to perform PE due to patient lack of access to video technology for today's visit  Results for orders placed or performed in visit on 12/23/18  Novel Coronavirus, NAA ([facility])  Result Value Ref Range   SARS-CoV-2, NAA Not Detected Not Detected      Assessment & Plan:   Problem List Items Addressed This Visit    None    Visit Diagnoses    Acute maxillary sinusitis, recurrence not specified    -  Primary   Tx with augmentin, mucinex, sinus rinses, allergy regimen. F/u if worsening or not improving   Relevant Medications   amoxicillin-clavulanate (AUGMENTIN) 875-125 MG tablet       Follow up plan: Return if symptoms worsen or fail to improve.

## 2019-10-05 ENCOUNTER — Ambulatory Visit: Payer: 59 | Attending: Internal Medicine

## 2019-10-05 DIAGNOSIS — Z23 Encounter for immunization: Secondary | ICD-10-CM

## 2019-10-05 NOTE — Progress Notes (Signed)
   Covid-19 Vaccination Clinic  Name:  DEWEY CORDREY    MRN: [id number] DOB: 1961/01/02  10/05/2019  Mr. Senick was observed post Covid-19 immunization for 15 minutes without incident. He was provided with Vaccine Information Sheet and instruction to access the V-Safe system.   Mr. Duren was instructed to call 911 with any severe reactions post vaccine: Marland Kitchen Difficulty breathing  . Swelling of face and throat  . A fast heartbeat  . A bad rash all over body  . Dizziness and weakness   Immunizations Administered    Name Date Dose VIS Date Route   Pfizer COVID-19 Vaccine 10/05/2019  8:26 AM 0.3 mL 06/09/2019 Intramuscular   Manufacturer: [place]   Lot: [id number]   [place]: [id number]

## 2019-10-31 ENCOUNTER — Ambulatory Visit: Payer: 59 | Attending: Internal Medicine

## 2019-10-31 DIAGNOSIS — Z23 Encounter for immunization: Secondary | ICD-10-CM

## 2019-10-31 NOTE — Progress Notes (Signed)
   Covid-19 Vaccination Clinic  Name:  JARMAN BERDUGO    MRN: [id number] DOB: 1960/10/10  10/31/2019  Mr. Crompton was observed post Covid-19 immunization for 15 minutes without incident. He was provided with Vaccine Information Sheet and instruction to access the V-Safe system.   Mr. Jenson was instructed to call 911 with any severe reactions post vaccine: Marland Kitchen Difficulty breathing  . Swelling of face and throat  . A fast heartbeat  . A bad rash all over body  . Dizziness and weakness   Immunizations Administered    Name Date Dose VIS Date Route   Pfizer COVID-19 Vaccine 10/31/2019  8:42 AM 0.3 mL 08/23/2018 Intramuscular   Manufacturer: [place]   Lot: [id number]   [place]: [id number]

## 2020-09-26 ENCOUNTER — Ambulatory Visit
Admission: RE | Admit: 2020-09-26 | Discharge: 2020-09-26 | Disposition: A | Discharge status: Home / Self Care | Payer: 59 | Source: Ambulatory Visit | Attending: Internal Medicine | Admitting: Internal Medicine

## 2020-09-26 ENCOUNTER — Encounter: Payer: Self-pay | Admitting: Internal Medicine

## 2020-09-26 ENCOUNTER — Telehealth (INDEPENDENT_AMBULATORY_CARE_PROVIDER_SITE_OTHER): Payer: 59 | Admitting: Internal Medicine

## 2020-09-26 ENCOUNTER — Other Ambulatory Visit: Payer: Self-pay

## 2020-09-26 ENCOUNTER — Ambulatory Visit
Admission: RE | Admit: 2020-09-26 | Discharge: 2020-09-26 | Disposition: A | Discharge status: Home / Self Care | Payer: 59 | Source: Home / Self Care | Attending: Internal Medicine | Admitting: Internal Medicine

## 2020-09-26 VITALS — Ht 71.0 in | Wt 198.0 lb

## 2020-09-26 DIAGNOSIS — M47814 Spondylosis without myelopathy or radiculopathy, thoracic region: Secondary | ICD-10-CM | POA: Diagnosis not present

## 2020-09-26 DIAGNOSIS — R0989 Other specified symptoms and signs involving the circulatory and respiratory systems: Secondary | ICD-10-CM

## 2020-09-26 DIAGNOSIS — M4184 Other forms of scoliosis, thoracic region: Secondary | ICD-10-CM | POA: Diagnosis not present

## 2020-09-26 DIAGNOSIS — Z981 Arthrodesis status: Secondary | ICD-10-CM | POA: Diagnosis not present

## 2020-09-26 MED ORDER — AMOXICILLIN-POT CLAVULANATE 500-125 MG PO TABS: 1.0000 | ORAL_TABLET | Freq: Two times a day (BID) | ORAL | 0 refills | Status: AC

## 2020-09-26 NOTE — Progress Notes (Signed)
   Ht 5\' 11"  (1.803 m)   Wt 198 lb (89.8 kg)   BMI 27.62 kg/m    Subjective:    Patient ID: Matthew Mercado, male    DOB: 1960-08-13, 60 y.o.   MRN: [id number]  HPI: Matthew Mercado is a 60 y.o. male  I connected with TAHJIR SILVERIA on 09/26/20 by a video enabled telemedicine application and verified that I am speaking with the correct person using two identifiers.  I discussed the limitations of evaluation and management by telemedicine. The patient expressed understanding and agreed to proceed.  Cough This is a new problem. The current episode started in the past 7 days. The cough is productive of brown sputum. Associated symptoms include headaches and a sore throat. Pertinent negatives include no chest pain, chills, ear congestion, ear pain, fever (augmentin), hemoptysis, postnasal drip, rhinorrhea, shortness of breath, sweats, weight loss or wheezing. Associated symptoms comments: Took 2 home covid tests -ve in the past week last was on wedneday.  Sore Throat  Associated symptoms include coughing and headaches. Pertinent negatives include no ear pain or shortness of breath.    Chief Complaint  Patient presents with  . Cough    Started about 7 days ago, has been having sore throat, chest congestions, hoarse, has been producing brown/green phlegm when he coughs, and everything gets worse at night  . Sore Throat    Relevant past medical, surgical, family and social history reviewed and updated as indicated. Interim medical history since our last visit reviewed. Allergies and medications reviewed and updated.  Review of Systems  Constitutional: Negative for chills, fever (augmentin) and weight loss.  HENT: Positive for sore throat. Negative for ear pain, postnasal drip and rhinorrhea.   Respiratory: Positive for cough. Negative for hemoptysis, shortness of breath and wheezing.   Cardiovascular: Negative for chest pain.  Neurological: Positive for headaches.    Per  HPI unless specifically indicated above     Objective:    Ht 5\' 11"  (1.803 m)   Wt 198 lb (89.8 kg)   BMI 27.62 kg/m   Wt Readings from Last 3 Encounters:  09/26/20 198 lb (89.8 kg)  06/29/19 199 lb (90.3 kg)  12/22/18 195 lb (88.5 kg)    Physical Exam Vitals reviewed: pt is virtual      Results for orders placed or performed in visit on 12/23/18  Novel Coronavirus, NAA ([facility])  Result Value Ref Range   SARS-CoV-2, NAA Not Detected Not Detected        Current Outpatient Medications:  .  amoxicillin-clavulanate (AUGMENTIN) 500-125 MG tablet, Take 1 tablet (500 mg total) by mouth in the morning and at bedtime for 7 days., Disp: 14 tablet, Rfl: 0    Assessment & Plan:  1. Chest congestion :  - will start pt on augmentin  - check CXR  - start pt on [place] test -ve  . pt advised to take Tylenol q 4- 6 hourly as needed. pt to take allegra q pm as needed and to call office if symptoms worsened pt verbalised understanding of such.     Problem List Items Addressed This Visit   None   Visit Diagnoses    Chest congestion    -  Primary   Relevant Orders   DG Chest 2 View       Follow up plan: No follow-ups on file.

## 2020-12-20 DIAGNOSIS — R208 Other disturbances of skin sensation: Secondary | ICD-10-CM | POA: Diagnosis not present

## 2020-12-20 DIAGNOSIS — I872 Venous insufficiency (chronic) (peripheral): Secondary | ICD-10-CM | POA: Diagnosis not present

## 2020-12-20 DIAGNOSIS — L298 Other pruritus: Secondary | ICD-10-CM | POA: Diagnosis not present

## 2020-12-20 DIAGNOSIS — L728 Other follicular cysts of the skin and subcutaneous tissue: Secondary | ICD-10-CM | POA: Diagnosis not present

## 2020-12-20 DIAGNOSIS — I781 Nevus, non-neoplastic: Secondary | ICD-10-CM | POA: Diagnosis not present

## 2020-12-20 DIAGNOSIS — R58 Hemorrhage, not elsewhere classified: Secondary | ICD-10-CM | POA: Diagnosis not present

## 2021-03-14 ENCOUNTER — Encounter: Payer: Self-pay | Admitting: Internal Medicine

## 2021-03-14 ENCOUNTER — Ambulatory Visit: Payer: 59 | Admitting: Internal Medicine

## 2021-03-14 ENCOUNTER — Other Ambulatory Visit: Payer: Self-pay

## 2021-03-14 VITALS — BP 114/72 | HR 80 | Temp 98.7°F | Wt 209.2 lb

## 2021-03-14 DIAGNOSIS — K140 Glossitis: Secondary | ICD-10-CM

## 2021-03-14 MED ORDER — BENZOCAINE 10 % MT GEL: 1.0000 "application " | OROMUCOSAL | 0 refills | Status: DC | PRN

## 2021-03-14 NOTE — Progress Notes (Signed)
BP 114/72   Pulse 80   Temp 98.7 F (37.1 C) (Oral)   Wt 209 lb 3.2 oz (94.9 kg)   SpO2 96%   BMI 29.18 kg/m    Subjective:    Patient ID: Matthew Mercado, male    DOB: 1960/10/11, 60 y.o.   MRN: [id number]  Chief Complaint  Patient presents with   Ulcer    Pt states he has an ulcer on his tongue that has been there for about 2 weeks     HPI: Matthew Mercado is a 60 y.o. male  Ulcer on side of the tounge, no odor from mouth, no weight loss, no difficulty swallowing.   Mouth Lesions  The current episode started more than 2 weeks ago. The onset was gradual. The problem occurs continuously. The problem has been unchanged. The problem is moderate. Associated symptoms include mouth sores. Pertinent negatives include no fever, no decreased vision, no eye itching, no photophobia, no abdominal pain, no constipation, no diarrhea, no congestion, no ear discharge, no ear pain, no headaches, no hearing loss, no rhinorrhea, no sore throat, no stridor, no cough, no URI, no wheezing, no eye discharge, no eye pain and no eye redness.   Chief Complaint  Patient presents with   Ulcer    Pt states he has an ulcer on his tongue that has been there for about 2 weeks     Relevant past medical, surgical, family and social history reviewed and updated as indicated. Interim medical history since our last visit reviewed. Allergies and medications reviewed and updated.  Review of Systems  Constitutional:  Negative for fever.  HENT:  Positive for mouth sores. Negative for congestion, ear discharge, ear pain, hearing loss, rhinorrhea and sore throat.   Eyes:  Negative for photophobia, pain, discharge, redness and itching.  Respiratory:  Negative for cough, wheezing and stridor.   Gastrointestinal:  Negative for abdominal pain, constipation and diarrhea.  Neurological:  Negative for headaches.   Per HPI unless specifically indicated above     Objective:    BP 114/72   Pulse 80   Temp  98.7 F (37.1 C) (Oral)   Wt 209 lb 3.2 oz (94.9 kg)   SpO2 96%   BMI 29.18 kg/m   Wt Readings from Last 3 Encounters:  03/14/21 209 lb 3.2 oz (94.9 kg)  09/26/20 198 lb (89.8 kg)  06/29/19 199 lb (90.3 kg)    Physical Exam Constitutional:      Appearance: Normal appearance.  HENT:     Head:     Comments: Ulcer on the left side of the lateral part of the toungue.  Cardiovascular:     Heart sounds: No murmur heard.   No friction rub.  Neurological:     Mental Status: He is alert.    Results for orders placed or performed in visit on 12/23/18  Novel Coronavirus, NAA ([facility])  Result Value Ref Range   SARS-CoV-2, NAA Not Detected Not Detected       No current outpatient medications on file.    Assessment & Plan:  Oral ulcer : rolled out edges concerning ?  Malignancy - Use orajel for pain relief - use mutlivitamins for such.  - And refer to ENT.   Problem List Items Addressed This Visit   None    Orders Placed This Encounter  Procedures   Ambulatory referral to ENT     No orders of the defined types were placed in this encounter.  Follow up plan: No follow-ups on file.

## 2021-03-15 ENCOUNTER — Other Ambulatory Visit: Payer: Self-pay

## 2021-03-15 DIAGNOSIS — Z1329 Encounter for screening for other suspected endocrine disorder: Secondary | ICD-10-CM

## 2021-03-15 DIAGNOSIS — Z136 Encounter for screening for cardiovascular disorders: Secondary | ICD-10-CM

## 2021-03-15 DIAGNOSIS — Z125 Encounter for screening for malignant neoplasm of prostate: Secondary | ICD-10-CM

## 2021-03-18 ENCOUNTER — Ambulatory Visit: Payer: 59 | Admitting: Internal Medicine

## 2021-03-18 DIAGNOSIS — K120 Recurrent oral aphthae: Secondary | ICD-10-CM | POA: Diagnosis not present

## 2021-03-24 ENCOUNTER — Other Ambulatory Visit: Payer: Self-pay

## 2021-03-24 ENCOUNTER — Other Ambulatory Visit: Payer: 59

## 2021-03-24 DIAGNOSIS — Z1329 Encounter for screening for other suspected endocrine disorder: Secondary | ICD-10-CM

## 2021-03-24 DIAGNOSIS — Z125 Encounter for screening for malignant neoplasm of prostate: Secondary | ICD-10-CM | POA: Diagnosis not present

## 2021-03-24 DIAGNOSIS — Z136 Encounter for screening for cardiovascular disorders: Secondary | ICD-10-CM

## 2021-03-25 LAB — PSA: Prostate Specific Ag, Serum: 0.6 ng/mL (ref 0.0–4.0)

## 2021-03-25 LAB — LIPID PANEL
Chol/HDL Ratio: 2.3 ratio (ref 0.0–5.0)
Cholesterol, Total: 136 mg/dL (ref 100–199)
HDL: 58 mg/dL (ref >39)
LDL Chol Calc (NIH): 64 mg/dL (ref 0–99)
Triglycerides: 70 mg/dL (ref 0–149)
VLDL Cholesterol Cal: 14 mg/dL (ref 5–40)

## 2021-04-01 ENCOUNTER — Other Ambulatory Visit: Payer: Self-pay

## 2021-04-01 ENCOUNTER — Ambulatory Visit (INDEPENDENT_AMBULATORY_CARE_PROVIDER_SITE_OTHER): Payer: 59 | Admitting: Internal Medicine

## 2021-04-01 ENCOUNTER — Encounter: Payer: Self-pay | Admitting: Internal Medicine

## 2021-04-01 VITALS — BP 120/80 | HR 79 | Ht 71.0 in | Wt 203.0 lb

## 2021-04-01 DIAGNOSIS — Z0000 Encounter for general adult medical examination without abnormal findings: Secondary | ICD-10-CM

## 2021-04-01 DIAGNOSIS — R7989 Other specified abnormal findings of blood chemistry: Secondary | ICD-10-CM | POA: Diagnosis not present

## 2021-04-01 LAB — URINALYSIS, ROUTINE W REFLEX MICROSCOPIC
Bilirubin, UA: NEGATIVE
Glucose, UA: NEGATIVE
Ketones, UA: NEGATIVE
Leukocytes,UA: NEGATIVE
Nitrite, UA: NEGATIVE
Protein,UA: NEGATIVE
RBC, UA: NEGATIVE
Specific Gravity, UA: 1.02 (ref 1.005–1.030)
Urobilinogen, Ur: 1 mg/dL (ref 0.2–1.0)
pH, UA: 7 (ref 5.0–7.5)

## 2021-04-01 NOTE — Progress Notes (Signed)
BP 120/80   Pulse 79   Ht '5\' 11"'  (1.803 m)   Wt 203 lb (92.1 kg)   SpO2 97%   BMI 28.31 kg/m    Subjective:    Patient ID: Matthew Mercado, male    DOB: 25-May-1961, 60 y.o.   MRN: [id number]  Chief Complaint  Patient presents with   Annual Exam    HPI: Matthew Mercado is a 60 y.o. male  Pt here for a physical  Healing left sided ulcer of the  tongue , was rx prednisone by ENT better now per pt, to see dentist to see if they can buff the tooth down.     Chief Complaint  Patient presents with   Annual Exam    Relevant past medical, surgical, family and social history reviewed and updated as indicated. Interim medical history since our last visit reviewed. Allergies and medications reviewed and updated.  Review of Systems  Constitutional:  Negative for activity change, appetite change, chills, fatigue and fever.  HENT:  Negative for congestion, ear discharge, ear pain and facial swelling.   Eyes:  Negative for pain, discharge and itching.  Respiratory:  Negative for cough, chest tightness, shortness of breath and wheezing.   Cardiovascular:  Negative for chest pain, palpitations and leg swelling.  Gastrointestinal:  Negative for abdominal distention, abdominal pain, blood in stool, constipation, diarrhea, nausea and vomiting.  Endocrine: Negative for cold intolerance, heat intolerance, polydipsia, polyphagia and polyuria.  Genitourinary:  Negative for difficulty urinating, dysuria, flank pain, frequency, hematuria and urgency.  Musculoskeletal:  Negative for arthralgias, gait problem, joint swelling and myalgias.  Skin:  Negative for color change, rash and wound.  Neurological:  Negative for dizziness, tremors, speech difficulty, weakness, light-headedness, numbness and headaches.  Hematological:  Does not bruise/bleed easily.  Psychiatric/Behavioral:  Negative for agitation, confusion, decreased concentration, sleep disturbance and suicidal ideas.    Per HPI  unless specifically indicated above     Objective:    BP 120/80   Pulse 79   Ht '5\' 11"'  (1.803 m)   Wt 203 lb (92.1 kg)   SpO2 97%   BMI 28.31 kg/m   Wt Readings from Last 3 Encounters:  04/01/21 203 lb (92.1 kg)  03/14/21 209 lb 3.2 oz (94.9 kg)  09/26/20 198 lb (89.8 kg)    Physical Exam Vitals and nursing note reviewed.  Constitutional:      General: He is not in acute distress.    Appearance: Normal appearance. He is not ill-appearing or diaphoretic.  HENT:     Head: Normocephalic and atraumatic.     Right Ear: Tympanic membrane and external ear normal. There is no impacted cerumen.     Left Ear: External ear normal.     Nose: No congestion or rhinorrhea.     Mouth/Throat:     Pharynx: No oropharyngeal exudate or posterior oropharyngeal erythema.  Eyes:     Conjunctiva/sclera: Conjunctivae normal.     Pupils: Pupils are equal, round, and reactive to light.  Cardiovascular:     Rate and Rhythm: Normal rate and regular rhythm.     Heart sounds: No murmur heard.   No friction rub. No gallop.  Pulmonary:     Effort: No respiratory distress.     Breath sounds: No stridor. No wheezing or rhonchi.  Chest:     Chest wall: No tenderness.  Abdominal:     General: Abdomen is flat. Bowel sounds are normal. There is no distension.  Palpations: Abdomen is soft. There is no mass.     Tenderness: There is no abdominal tenderness. There is no guarding.  Musculoskeletal:        General: No swelling or deformity.     Cervical back: Normal range of motion and neck supple. No rigidity or tenderness.     Right lower leg: No edema.     Left lower leg: No edema.  Skin:    General: Skin is warm and dry.     Coloration: Skin is not jaundiced.     Findings: No erythema.  Neurological:     Mental Status: He is alert and oriented to person, place, and time. Mental status is at baseline.  Psychiatric:        Mood and Affect: Mood normal.        Behavior: Behavior normal.         Thought Content: Thought content normal.        Judgment: Judgment normal.    Results for orders placed or performed in visit on 04/01/21  TSH  Result Value Ref Range   TSH 1.680 0.450 - 4.500 uIU/mL  CBC with Differential/Platelet  Result Value Ref Range   WBC 6.6 3.4 - 10.8 x10E3/uL   RBC 4.29 4.14 - 5.80 x10E6/uL   Hemoglobin 14.9 13.0 - 17.7 g/dL   Hematocrit 41.9 37.5 - 51.0 %   MCV 98 (H) 79 - 97 fL   MCH 34.7 (H) 26.6 - 33.0 pg   MCHC 35.6 31.5 - 35.7 g/dL   RDW 12.5 11.6 - 15.4 %   Platelets 139 (L) 150 - 450 x10E3/uL   Neutrophils 68 Not Estab. %   Lymphs 16 Not Estab. %   Monocytes 12 Not Estab. %   Eos 2 Not Estab. %   Basos 1 Not Estab. %   Neutrophils Absolute 4.6 1.4 - 7.0 x10E3/uL   Lymphocytes Absolute 1.1 0.7 - 3.1 x10E3/uL   Monocytes Absolute 0.8 0.1 - 0.9 x10E3/uL   EOS (ABSOLUTE) 0.2 0.0 - 0.4 x10E3/uL   Basophils Absolute 0.0 0.0 - 0.2 x10E3/uL   Immature Granulocytes 1 Not Estab. %   Immature Grans (Abs) 0.0 0.0 - 0.1 x10E3/uL  Comprehensive metabolic panel  Result Value Ref Range   Glucose 124 (H) 70 - 99 mg/dL   BUN 13 6 - 24 mg/dL   Creatinine, Ser 1.01 0.76 - 1.27 mg/dL   eGFR 86 >59 mL/min/1.73   BUN/Creatinine Ratio 13 9 - 20   Sodium 139 134 - 144 mmol/L   Potassium 4.6 3.5 - 5.2 mmol/L   Chloride 100 96 - 106 mmol/L   CO2 22 20 - 29 mmol/L   Calcium 8.3 (L) 8.7 - 10.2 mg/dL   Total Protein 5.8 (L) 6.0 - 8.5 g/dL   Albumin 3.3 (L) 3.8 - 4.9 g/dL   Globulin, Total 2.5 1.5 - 4.5 g/dL   Albumin/Globulin Ratio 1.3 1.2 - 2.2   Bilirubin Total 4.4 (H) 0.0 - 1.2 mg/dL   Alkaline Phosphatase 99 44 - 121 IU/L   AST 153 (H) 0 - 40 IU/L   ALT 119 (H) 0 - 44 IU/L  Urinalysis, Routine w reflex microscopic  Result Value Ref Range   Specific Gravity, UA 1.020 1.005 - 1.030   pH, UA 7.0 5.0 - 7.5   Color, UA Yellow Yellow   Appearance Ur Clear Clear   Leukocytes,UA Negative Negative   Protein,UA Negative Negative/Trace   Glucose, UA Negative  Negative  Ketones, UA Negative Negative   RBC, UA Negative Negative   Bilirubin, UA Negative Negative   Urobilinogen, Ur 1.0 0.2 - 1.0 mg/dL   Nitrite, UA Negative Negative        Current Outpatient Medications:    benzocaine (ORAJEL) 10 % mucosal gel, Use as directed 1 application in the mouth or throat as needed for mouth pain., Disp: 5.3 g, Rfl: 0    Assessment & Plan:  PHYSICAL :  Physical Wnl will check CMP, FLP, CBC,TSH, PSA.  Problem List Items Addressed This Visit   None Visit Diagnoses     Annual physical exam    -  Primary   Relevant Orders   TSH (Completed)   CBC with Differential/Platelet (Completed)   Comprehensive metabolic panel (Completed)   Urinalysis, Routine w reflex microscopic (Completed)   Elevated LFTs       Relevant Orders   Comprehensive metabolic panel   Bilirubin, Direct   US Abdomen Limited RUQ (LIVER/GB)   Lipase   Gamma GT        Orders Placed This Encounter  Procedures   US Abdomen Limited RUQ (LIVER/GB)   TSH   CBC with Differential/Platelet   Comprehensive metabolic panel   Urinalysis, Routine w reflex microscopic   Comprehensive metabolic panel   Bilirubin, Direct   Lipase   Gamma GT     No orders of the defined types were placed in this encounter.    Follow up plan: Return in about 1 year (around 04/01/2022).  Health Maintenance : PSA : 2022  Cscope : 2024 ,has to have this 5 yr sees D.r Allen Norris for such  Pneumonia vaccine :declines. FLu vaccine : tommorow.   Obtain results on labs from his physical today on October 5 at 9 AM patient called and discussed labs below.  On further questioning he says that he has been itching and has bilirubin of 4.4 does claim to Drinking beer about 10 -12 in a week  Results for REYAN, HELLE (MRN [id number]) as of 04/01/2021 10:47  07/22/2015 21:33 AST: 63 (H) ALT: 108 (H)  07/23/2015 00:39  11/12/2015 10:36 AST: 68 (H) ALT: 70 (H)  10/20/2017 14:18 AST: 43 (H) ALT: 53  (H) Will need to follow-up with further labs and investigations including ultrasound of his liver possibly see a GI doctor ASAP

## 2021-04-02 ENCOUNTER — Encounter: Payer: Self-pay | Admitting: Internal Medicine

## 2021-04-02 ENCOUNTER — Ambulatory Visit (INDEPENDENT_AMBULATORY_CARE_PROVIDER_SITE_OTHER): Payer: 59 | Admitting: Internal Medicine

## 2021-04-02 VITALS — BP 130/79 | HR 79 | Temp 98.9°F | Ht 70.98 in | Wt 211.6 lb

## 2021-04-02 DIAGNOSIS — F1010 Alcohol abuse, uncomplicated: Secondary | ICD-10-CM

## 2021-04-02 DIAGNOSIS — Z0000 Encounter for general adult medical examination without abnormal findings: Secondary | ICD-10-CM | POA: Insufficient documentation

## 2021-04-02 DIAGNOSIS — R7989 Other specified abnormal findings of blood chemistry: Secondary | ICD-10-CM | POA: Diagnosis not present

## 2021-04-02 DIAGNOSIS — L299 Pruritus, unspecified: Secondary | ICD-10-CM | POA: Diagnosis not present

## 2021-04-02 DIAGNOSIS — F1011 Alcohol abuse, in remission: Secondary | ICD-10-CM | POA: Insufficient documentation

## 2021-04-02 HISTORY — DX: Alcohol abuse, uncomplicated: F10.10

## 2021-04-02 LAB — CBC WITH DIFFERENTIAL/PLATELET
Basophils Absolute: 0 10*3/uL (ref 0.0–0.2)
Basos: 1 %
EOS (ABSOLUTE): 0.2 10*3/uL (ref 0.0–0.4)
Eos: 2 %
Hematocrit: 41.9 % (ref 37.5–51.0)
Hemoglobin: 14.9 g/dL (ref 13.0–17.7)
Immature Grans (Abs): 0 10*3/uL (ref 0.0–0.1)
Immature Granulocytes: 1 %
Lymphocytes Absolute: 1.1 10*3/uL (ref 0.7–3.1)
Lymphs: 16 %
MCH: 34.7 pg — ABNORMAL HIGH (ref 26.6–33.0)
MCHC: 35.6 g/dL (ref 31.5–35.7)
MCV: 98 fL — ABNORMAL HIGH (ref 79–97)
Monocytes Absolute: 0.8 10*3/uL (ref 0.1–0.9)
Monocytes: 12 %
Neutrophils Absolute: 4.6 10*3/uL (ref 1.4–7.0)
Neutrophils: 68 %
Platelets: 139 10*3/uL — ABNORMAL LOW (ref 150–450)
RBC: 4.29 x10E6/uL (ref 4.14–5.80)
RDW: 12.5 % (ref 11.6–15.4)
WBC: 6.6 10*3/uL (ref 3.4–10.8)

## 2021-04-02 LAB — TSH: TSH: 1.68 u[IU]/mL (ref 0.450–4.500)

## 2021-04-02 LAB — COMPREHENSIVE METABOLIC PANEL
ALT: 119 IU/L — ABNORMAL HIGH (ref 0–44)
AST: 153 IU/L — ABNORMAL HIGH (ref 0–40)
Albumin/Globulin Ratio: 1.3 (ref 1.2–2.2)
Albumin: 3.3 g/dL — ABNORMAL LOW (ref 3.8–4.9)
Alkaline Phosphatase: 99 IU/L (ref 44–121)
BUN/Creatinine Ratio: 13 (ref 9–20)
BUN: 13 mg/dL (ref 6–24)
Bilirubin Total: 4.4 mg/dL — ABNORMAL HIGH (ref 0.0–1.2)
CO2: 22 mmol/L (ref 20–29)
Calcium: 8.3 mg/dL — ABNORMAL LOW (ref 8.7–10.2)
Chloride: 100 mmol/L (ref 96–106)
Creatinine, Ser: 1.01 mg/dL (ref 0.76–1.27)
Globulin, Total: 2.5 g/dL (ref 1.5–4.5)
Glucose: 124 mg/dL — ABNORMAL HIGH (ref 70–99)
Potassium: 4.6 mmol/L (ref 3.5–5.2)
Sodium: 139 mmol/L (ref 134–144)
Total Protein: 5.8 g/dL — ABNORMAL LOW (ref 6.0–8.5)
eGFR: 86 mL/min/{1.73_m2} (ref >59)

## 2021-04-02 NOTE — Progress Notes (Signed)
BP 130/79   Pulse 79   Temp 98.9 F (37.2 C) (Oral)   Ht 5' 10.98" (1.803 m)   Wt 211 lb 9.6 oz (96 kg)   SpO2 97%   BMI 29.53 kg/m    Subjective:    Patient ID: Matthew Mercado, male    DOB: 08/02/1960, 60 y.o.   MRN: [id number]  No chief complaint on file.   HPI: Matthew Mercado is a 60 y.o. male  Here for follow-up on his elevated liver enzymes.  He does complain of itching all over his body had seen dermatology who just said that he needs nerve testing however bilirubin noted to be very high today which could explain why he is itchy. Has been drinking 10-15 beers a week for the last 25 years.  Denies any IV drug abuse but does have a history of illicit drug abuse   No chief complaint on file.   Relevant past medical, surgical, family and social history reviewed and updated as indicated. Interim medical history since our last visit reviewed. Allergies and medications reviewed and updated.  Review of Systems  Per HPI unless specifically indicated above     Objective:    BP 130/79   Pulse 79   Temp 98.9 F (37.2 C) (Oral)   Ht 5' 10.98" (1.803 m)   Wt 211 lb 9.6 oz (96 kg)   SpO2 97%   BMI 29.53 kg/m   Wt Readings from Last 3 Encounters:  04/02/21 211 lb 9.6 oz (96 kg)  04/01/21 203 lb (92.1 kg)  03/14/21 209 lb 3.2 oz (94.9 kg)    Physical Exam Vitals and nursing note reviewed.  Constitutional:      General: He is not in acute distress.    Appearance: Normal appearance. He is not ill-appearing or diaphoretic.  HENT:     Head: Normocephalic and atraumatic.     Right Ear: Tympanic membrane and external ear normal. There is no impacted cerumen.     Left Ear: External ear normal.     Nose: No congestion or rhinorrhea.     Mouth/Throat:     Pharynx: No oropharyngeal exudate or posterior oropharyngeal erythema.  Eyes:     Conjunctiva/sclera: Conjunctivae normal.     Pupils: Pupils are equal, round, and reactive to light.  Cardiovascular:     Rate  and Rhythm: Normal rate and regular rhythm.     Heart sounds: No murmur heard.   No friction rub. No gallop.  Pulmonary:     Effort: No respiratory distress.     Breath sounds: No wheezing.  Abdominal:     General: Abdomen is flat. Bowel sounds are normal. There is distension.     Palpations: Abdomen is soft. There is no mass.     Tenderness: There is no abdominal tenderness.  Musculoskeletal:     Cervical back: Normal range of motion and neck supple. No rigidity or tenderness.     Left lower leg: No edema.  Skin:    General: Skin is warm and dry.     Coloration: Skin is jaundiced.  Neurological:     Mental Status: He is alert.  Psychiatric:        Mood and Affect: Mood normal.    Results for orders placed or performed in visit on 04/01/21  TSH  Result Value Ref Range   TSH 1.680 0.450 - 4.500 uIU/mL  CBC with Differential/Platelet  Result Value Ref Range   WBC 6.6 3.4 -  10.8 x10E3/uL   RBC 4.29 4.14 - 5.80 x10E6/uL   Hemoglobin 14.9 13.0 - 17.7 g/dL   Hematocrit 41.9 37.5 - 51.0 %   MCV 98 (H) 79 - 97 fL   MCH 34.7 (H) 26.6 - 33.0 pg   MCHC 35.6 31.5 - 35.7 g/dL   RDW 12.5 11.6 - 15.4 %   Platelets 139 (L) 150 - 450 x10E3/uL   Neutrophils 68 Not Estab. %   Lymphs 16 Not Estab. %   Monocytes 12 Not Estab. %   Eos 2 Not Estab. %   Basos 1 Not Estab. %   Neutrophils Absolute 4.6 1.4 - 7.0 x10E3/uL   Lymphocytes Absolute 1.1 0.7 - 3.1 x10E3/uL   Monocytes Absolute 0.8 0.1 - 0.9 x10E3/uL   EOS (ABSOLUTE) 0.2 0.0 - 0.4 x10E3/uL   Basophils Absolute 0.0 0.0 - 0.2 x10E3/uL   Immature Granulocytes 1 Not Estab. %   Immature Grans (Abs) 0.0 0.0 - 0.1 x10E3/uL  Comprehensive metabolic panel  Result Value Ref Range   Glucose 124 (H) 70 - 99 mg/dL   BUN 13 6 - 24 mg/dL   Creatinine, Ser 1.01 0.76 - 1.27 mg/dL   eGFR 86 >59 mL/min/1.73   BUN/Creatinine Ratio 13 9 - 20   Sodium 139 134 - 144 mmol/L   Potassium 4.6 3.5 - 5.2 mmol/L   Chloride 100 96 - 106 mmol/L   CO2 22 20  - 29 mmol/L   Calcium 8.3 (L) 8.7 - 10.2 mg/dL   Total Protein 5.8 (L) 6.0 - 8.5 g/dL   Albumin 3.3 (L) 3.8 - 4.9 g/dL   Globulin, Total 2.5 1.5 - 4.5 g/dL   Albumin/Globulin Ratio 1.3 1.2 - 2.2   Bilirubin Total 4.4 (H) 0.0 - 1.2 mg/dL   Alkaline Phosphatase 99 44 - 121 IU/L   AST 153 (H) 0 - 40 IU/L   ALT 119 (H) 0 - 44 IU/L  Urinalysis, Routine w reflex microscopic  Result Value Ref Range   Specific Gravity, UA 1.020 1.005 - 1.030   pH, UA 7.0 5.0 - 7.5   Color, UA Yellow Yellow   Appearance Ur Clear Clear   Leukocytes,UA Negative Negative   Protein,UA Negative Negative/Trace   Glucose, UA Negative Negative   Ketones, UA Negative Negative   RBC, UA Negative Negative   Bilirubin, UA Negative Negative   Urobilinogen, Ur 1.0 0.2 - 1.0 mg/dL   Nitrite, UA Negative Negative        Current Outpatient Medications:    benzocaine (ORAJEL) 10 % mucosal gel, Use as directed 1 application in the mouth or throat as needed for mouth pain., Disp: 5.3 g, Rfl: 0   cetirizine (ZYRTEC) 10 MG tablet, Take 10 mg by mouth daily., Disp: , Rfl:     Assessment & Plan:  Itching/hyperbilirubinemia : we will check hepatitis panel repeat LFTs check ACT bilirubin check ultrasound liver Consider GI referral.    Problem List Items Addressed This Visit       Other   Elevated LFTs - Primary     Orders Placed This Encounter  Procedures   Hepatitis A antibody, total   Hepatitis B core antibody, IgM   Hepatitis B surface antibody,quantitative   Hepatitis C genotype   AMB Referral to [facility] Co[facility]     No orders of the defined types were placed in this encounter.    Follow up plan: No follow-ups on file.

## 2021-04-02 NOTE — Progress Notes (Signed)
Platelet   10/20/2017 14:18 Platelets: 224  04/01/2021 11:01 Platelets: 139 (L)  Results for Matthew Mercado, Matthew Mercado (MRN [id number]) as of 04/02/2021 09:19  10/20/2017 14:18 AST: 43 (H) ALT: 53 (H) Total Protein: 7.1 Total Bilirubin: 1.1   Pt will be in today @ 4 , has been drinkng excessively  Will need to d/w pt the above and ? Needs US liver / Ct abdoemn  Platelets lower than last time as well  04/01/2021 11:01 AST: 153 (H) ALT: 119 (H) Total Protein: 5.8 (L) Total Bilirubin: 4.4 (H)

## 2021-04-02 NOTE — Patient Instructions (Signed)
Results for DEMETRIAS, GOODBAR (MRN [id number]) as of 04/02/2021 16:21  Ref. Range 04/01/2021 11:01  COMPREHENSIVE METABOLIC PANEL Unknown Rpt (A)  Sodium Latest Ref Range: 134 - 144 mmol/L 139  Potassium Latest Ref Range: 3.5 - 5.2 mmol/L 4.6  Chloride Latest Ref Range: 96 - 106 mmol/L 100  CO2 Latest Ref Range: 20 - 29 mmol/L 22  Glucose Latest Ref Range: 70 - 99 mg/dL 124 (H)  BUN Latest Ref Range: 6 - 24 mg/dL 13  Creatinine Latest Ref Range: 0.76 - 1.27 mg/dL 1.01  Calcium Latest Ref Range: 8.7 - 10.2 mg/dL 8.3 (L)  BUN/Creatinine Ratio Latest Ref Range: 9 - 20  13  eGFR Latest Ref Range: >59 mL/min/1.73 86  Alkaline Phosphatase Latest Ref Range: 44 - 121 IU/L 99  Albumin Latest Ref Range: 3.8 - 4.9 g/dL 3.3 (L)  Albumin/Globulin Ratio Latest Ref Range: 1.2 - 2.2  1.3  AST Latest Ref Range: 0 - 40 IU/L 153 (H)  ALT Latest Ref Range: 0 - 44 IU/L 119 (H)  Total Protein Latest Ref Range: 6.0 - 8.5 g/dL 5.8 (L)  Total Bilirubin Latest Ref Range: 0.0 - 1.2 mg/dL 4.4 (H)  Globulin, Total Latest Ref Range: 1.5 - 4.5 g/dL 2.5  WBC Latest Ref Range: 3.4 - 10.8 x10E3/uL 6.6  RBC Latest Ref Range: 4.14 - 5.80 x10E6/uL 4.29  Hemoglobin Latest Ref Range: 13.0 - 17.7 g/dL 14.9  HCT Latest Ref Range: 37.5 - 51.0 % 41.9  MCV Latest Ref Range: 79 - 97 fL 98 (H)  MCH Latest Ref Range: 26.6 - 33.0 pg 34.7 (H)  MCHC Latest Ref Range: 31.5 - 35.7 g/dL 35.6  RDW Latest Ref Range: 11.6 - 15.4 % 12.5  Platelets Latest Ref Range: 150 - 450 x10E3/uL 139 (L)  Neutrophils Latest Ref Range: Not Estab. % 68  Immature Granulocytes Latest Ref Range: Not Estab. % 1  NEUT# Latest Ref Range: 1.4 - 7.0 x10E3/uL 4.6  Lymphocyte # Latest Ref Range: 0.7 - 3.1 x10E3/uL 1.1  Monocytes Absolute Latest Ref Range: 0.1 - 0.9 x10E3/uL 0.8  Basophils Absolute Latest Ref Range: 0.0 - 0.2 x10E3/uL 0.0  Immature Grans (Abs) Latest Ref Range: 0.0 - 0.1 x10E3/uL 0.0  Lymphs Latest Ref Range: Not Estab. % 16  Monocytes Latest  Ref Range: Not Estab. % 12  Basos Latest Ref Range: Not Estab. % 1  Eos Latest Ref Range: Not Estab. % 2  EOS (ABSOLUTE) Latest Ref Range: 0.0 - 0.4 x10E3/uL 0.2  TSH Latest Ref Range: 0.450 - 4.500 uIU/mL 1.680

## 2021-04-03 ENCOUNTER — Telehealth: Payer: Self-pay

## 2021-04-03 NOTE — Chronic Care Management (AMB) (Signed)
  Care Management   Note  04/03/2021 Name: ELOY FEHL MRN: [id number] DOB: 1960-07-08  TAMERON LAMA is a 60 y.o. year old male who is a primary care patient of Vigg, Avanti, MD. I reached out to [name] by phone today in response to a referral sent by Mr. Maciah Schweigert Carbary's primary care provider.   Mr. Hull was given information about care management services today including:  Care management services include personalized support from designated clinical staff supervised by his physician, including individualized plan of care and coordination with other care providers 24/7 contact phone numbers for assistance for urgent and routine care needs. The patient may stop care management services at any time by phone call to the office staff.  Patient agreed to services and verbal consent obtained.   Follow up plan: Telephone appointment with care management team member scheduled for:04/10/2021  Noreene Larsson, [place], [place], [place] [address] Direct Dial: [phone] [email or web address]

## 2021-04-05 LAB — HEPATITIS C GENOTYPE

## 2021-04-05 LAB — HEPATITIS B CORE ANTIBODY, IGM: Hep B C IgM: NEGATIVE

## 2021-04-05 LAB — HEPATITIS B SURFACE ANTIBODY, QUANTITATIVE: Hepatitis B Surf Ab Quant: <3.1 m[IU]/mL — ABNORMAL LOW (ref >9.9)

## 2021-04-05 LAB — HEPATITIS A ANTIBODY, TOTAL: hep A Total Ab: NEGATIVE

## 2021-04-09 DIAGNOSIS — K120 Recurrent oral aphthae: Secondary | ICD-10-CM | POA: Diagnosis not present

## 2021-04-10 ENCOUNTER — Ambulatory Visit: Payer: 59 | Admitting: Licensed Clinical Social Worker

## 2021-04-10 NOTE — Chronic Care Management (AMB) (Signed)
    Clinical Social Work  Care Management   Phone Outreach    04/10/2021 Name: Matthew Mercado MRN: [id number] DOB: [birth year]  Matthew Mercado is a 60 y.o. year old male who is a primary care patient of Vigg, Avanti, MD .   Reason for referral: [place] and [facility].    CCM LCSW reached out to patient today by phone to introduce self, assess needs and offer Care Management services and interventions.    Unable to keep phone appointment today and requested to reschedule.  Plan:Appointment was rescheduled with CCM LCSW  for 04/21/21  Review of patient status, including review of consultants reports, relevant laboratory and other test results, and collaboration with appropriate care team members and the patient's provider was performed as part of comprehensive patient evaluation and provision of care management services.    Christa See, MSW, [email or web address] Phone [phone] 10:48 AM

## 2021-04-14 ENCOUNTER — Ambulatory Visit
Admission: RE | Admit: 2021-04-14 | Discharge: 2021-04-14 | Disposition: A | Discharge status: Home / Self Care | Payer: 59 | Source: Ambulatory Visit | Attending: Internal Medicine | Admitting: Internal Medicine

## 2021-04-14 ENCOUNTER — Other Ambulatory Visit: Payer: Self-pay

## 2021-04-14 DIAGNOSIS — R7989 Other specified abnormal findings of blood chemistry: Secondary | ICD-10-CM | POA: Diagnosis present

## 2021-04-14 DIAGNOSIS — K7689 Other specified diseases of liver: Secondary | ICD-10-CM | POA: Diagnosis not present

## 2021-04-15 ENCOUNTER — Ambulatory Visit: Payer: 59 | Admitting: Internal Medicine

## 2021-04-15 ENCOUNTER — Encounter: Payer: Self-pay | Admitting: Internal Medicine

## 2021-04-15 ENCOUNTER — Telehealth: Payer: Self-pay | Admitting: Internal Medicine

## 2021-04-15 ENCOUNTER — Other Ambulatory Visit: Payer: Self-pay

## 2021-04-15 VITALS — BP 126/76 | HR 85 | Temp 98.6°F | Ht 70.98 in | Wt 209.0 lb

## 2021-04-15 DIAGNOSIS — K7460 Unspecified cirrhosis of liver: Secondary | ICD-10-CM

## 2021-04-15 HISTORY — DX: Unspecified cirrhosis of liver: K74.60

## 2021-04-15 NOTE — Telephone Encounter (Signed)
And results of right upper quadrant ultrasound which show cirrhosis patient has had abnormally elevated bilirubin at 4.4 and abnormal AST and ALT in the background of heavy EtOH abuse.   Will need to see GI/hepatology ASAP patient will be in tomorrow to discuss further course of action and referrals.   RUQ [place] :  Slightly nodular liver contour with coarse echotexture, findings are suggestive of cirrhosis   D/w pt he will come in to the office tomorrow to d/w me about his recent diagnosis.   Lab Results  Component Value Date   ALT 119 (H) 04/01/2021   AST 153 (H) 04/01/2021   ALKPHOS 99 04/01/2021   BILITOT 4.4 (H) 04/01/2021    Matthew Mercado

## 2021-04-15 NOTE — Progress Notes (Signed)
BP 126/76   Pulse 85   Temp 98.6 F (37 C) (Oral)   Ht 5' 10.98" (1.803 m)   Wt 209 lb (94.8 kg)   SpO2 96%   BMI 29.16 kg/m    Subjective:    Patient ID: Matthew Mercado, male    DOB: [birth year], 61 y.o.   MRN: [id number]  No chief complaint on file.   HPI: Matthew Mercado is a 60 y.o. male  Patient is here for follow-up from his ultrasound which shows nodular liver and has heaptic cirrhosis  His hepatitis panel shows a positive hepatitis C , genotype Ia Per patient he has been cutting back on alcohol and is down to 6-12 bottles of beer - ETOH a week Was drinking a lot of sprite now. Hasnt had any of this for 2- 3 weeks now.  A1c 4.5.  FSBS high Has indigestion, no diarrhea or constipation, has had severe leg cramping.  Gastroesophageal Reflux He complains of belching and heartburn. He reports no abdominal pain, no chest pain, no choking, no hoarse voice, no nausea, no stridor or no wheezing.   No chief complaint on file.   Relevant past medical, surgical, family and social history reviewed and updated as indicated. Interim medical history since our last visit reviewed. Allergies and medications reviewed and updated.  Review of Systems  HENT:  Negative for hoarse voice.   Respiratory:  Negative for choking and wheezing.   Cardiovascular:  Negative for chest pain.  Gastrointestinal:  Positive for heartburn. Negative for abdominal pain and nausea.   Per HPI unless specifically indicated above     Objective:    BP 126/76   Pulse 85   Temp 98.6 F (37 C) (Oral)   Ht 5' 10.98" (1.803 m)   Wt 209 lb (94.8 kg)   SpO2 96%   BMI 29.16 kg/m   Wt Readings from Last 3 Encounters:  04/15/21 209 lb (94.8 kg)  04/02/21 211 lb 9.6 oz (96 kg)  04/01/21 203 lb (92.1 kg)    Physical Exam Vitals and nursing note reviewed.  Constitutional:      General: He is not in acute distress.    Appearance: Normal appearance. He is not ill-appearing or diaphoretic.  HENT:      Head: Normocephalic.  Eyes:     General: Scleral icterus present.     Pupils: Pupils are equal, round, and reactive to light.  Cardiovascular:     Rate and Rhythm: Normal rate and regular rhythm.  Pulmonary:     Breath sounds: No wheezing.  Chest:     Chest wall: No tenderness.  Abdominal:     General: Abdomen is flat. Bowel sounds are normal. There is distension.     Palpations: Abdomen is soft.     Tenderness: There is no abdominal tenderness. There is no right CVA tenderness, guarding or rebound.     Hernia: No hernia is present.  Musculoskeletal:     Cervical back: Normal range of motion. No rigidity or tenderness.     Left lower leg: No edema.  Skin:    Coloration: Skin is jaundiced. Skin is not pale.     Findings: Rash present. No bruising, erythema or lesion.  Neurological:     Mental Status: He is alert.    Results for orders placed or performed in visit on 04/02/21  Hepatitis A antibody, total  Result Value Ref Range   hep A Total Ab Negative Negative  Hepatitis B core  antibody, IgM  Result Value Ref Range   Hep B C IgM Negative Negative  Hepatitis B surface antibody,quantitative  Result Value Ref Range   Hepatitis B Surf Ab Quant <3.1 (L) Immunity>9.9 mIU/mL  Hepatitis C genotype  Result Value Ref Range   Hepatitis C Genotype 1a    Please Note (HCV): Comment         Current Outpatient Medications:    benzocaine (ORAJEL) 10 % mucosal gel, Use as directed 1 application in the mouth or throat as needed for mouth pain., Disp: 5.3 g, Rfl: 0   cetirizine (ZYRTEC) 10 MG tablet, Take 10 mg by mouth daily., Disp: , Rfl:     Assessment & Plan:  Liver cirrhosis with hepatitis C positive will refer to GI ASAP discussed with Dr. [name] by secure chat.  Appreciate input patient will be seen by them as soon as they can schedule him.    Problem List Items Addressed This Visit       Digestive   Cirrhosis of liver without ascites ([place]) - Primary   Relevant  Orders   Ambulatory referral to Gastroenterology   Comprehensive metabolic panel   Bilirubin, Direct     Orders Placed This Encounter  Procedures   Comprehensive metabolic panel   Bilirubin, Direct   Ambulatory referral to Gastroenterology     No orders of the defined types were placed in this encounter.    Follow up plan: No follow-ups on file.

## 2021-04-16 ENCOUNTER — Ambulatory Visit: Payer: 59 | Admitting: Internal Medicine

## 2021-04-17 ENCOUNTER — Other Ambulatory Visit: Payer: Self-pay

## 2021-04-17 ENCOUNTER — Ambulatory Visit: Payer: 59 | Admitting: Gastroenterology

## 2021-04-17 ENCOUNTER — Encounter: Payer: Self-pay | Admitting: Gastroenterology

## 2021-04-17 VITALS — BP 110/68 | HR 84 | Temp 98.4°F | Ht 70.0 in | Wt 207.0 lb

## 2021-04-17 DIAGNOSIS — B182 Chronic viral hepatitis C: Secondary | ICD-10-CM

## 2021-04-17 DIAGNOSIS — I8500 Esophageal varices without bleeding: Secondary | ICD-10-CM

## 2021-04-17 DIAGNOSIS — Z23 Encounter for immunization: Secondary | ICD-10-CM

## 2021-04-17 NOTE — Progress Notes (Signed)
Gastroenterology Consultation  Referring Provider:     Charlynne Cousins, MD Primary Care Physician:  Charlynne Cousins, MD Primary Gastroenterologist:  Dr. Allen Norris     Reason for Consultation:     Hepatitis C and cirrhosis        HPI:   Matthew Mercado is a 60 y.o. y/o male referred for consultation & management of hepatitis C and cirrhosis by Dr. Charlynne Cousins, MD. this patient comes in today accompanied by his wife who works at the hospital with a history of alcohol abuse and was found to have hepatitis C genotype Ia.  The patient states that he has a homemade tattoo from many years ago and did use IV drugs over 30 years ago.  He reports that he has continued to drink and is now drinking approximately 5 beers a day.  The patient has had chronically elevated liver enzymes but his most recent liver enzymes were consistent with alcoholic hepatitis with a bilirubin of 4.4.  The patient states that he has been to dermatology because of tingling in his skin but no overt itching.  The patient had an ultrasound on 10/17 that showed:  IMPRESSION: Slightly nodular liver contour with coarse echotexture, findings are suggestive of cirrhosis.  There has been no report of ascites and his hepatitis B a antibody and B antibody were both consistent with the patient not having immunity.  He denies any abdominal pain at the present time.  He also denies any black stools or bloody stools.  Past Medical History:  Diagnosis Date   Cervical spine degeneration    Depression     Past Surgical History:  Procedure Laterality Date   CHOLECYSTECTOMY     COLONOSCOPY WITH PROPOFOL N/A 12/08/2017   Procedure: COLONOSCOPY WITH PROPOFOL;  Surgeon: [name], MD;  Location: [facility];  Service: Endoscopy;  Laterality: N/A;   SPINE SURGERY     discectomy and fusion C 4/5   TONSILLECTOMY      Prior to Admission medications   Medication Sig Start Date End Date Taking? Authorizing Provider  cetirizine  (ZYRTEC) 10 MG tablet Take 10 mg by mouth daily.   Yes [provider]  Multiple Vitamins-Minerals (MENS MULTIVITAMIN PO) Take by mouth.   Yes [provider]  RIBOFLAVIN PO Take by mouth.   Yes [provider]  benzocaine (ORAJEL) 10 % mucosal gel Use as directed 1 application in the mouth or throat as needed for mouth pain. Patient not taking: Reported on 04/17/2021 03/14/21   Charlynne Cousins, MD    Family History  Problem Relation Age of Onset   Cancer Mother    Cancer Father        prostate   Hypertension Father    Sleep apnea Sister    Hypertension Sister    Drug abuse Sister      Social History   Tobacco Use   Smoking status: Former    Types: Cigarettes    Quit date: 11/12/2010    Years since quitting: 10.4   Smokeless tobacco: Never  Vaping Use   Vaping Use: Never used  Substance Use Topics   Alcohol use: Yes    Comment: 6 to 10 beers daily   Drug use: No    Comment: Used in the past    Allergies as of 04/17/2021   (No Known Allergies)    Review of Systems:    All systems reviewed and negative except where noted in HPI.   Physical Exam:  BP 110/68 (BP Location: Right Arm, Patient Position: Sitting, Cuff Size: Large)   Pulse 84   Temp 98.4 F (36.9 C) (Oral)   Ht 5\' 10"  (1.778 m)   Wt 207 lb (93.9 kg)   BMI 29.70 kg/m  No LMP for male patient. General:   Alert,  Well-developed, well-nourished, pleasant and cooperative in NAD Head:  Normocephalic and atraumatic. Eyes:  Sclera clear, no icterus.   Conjunctiva pink. Ears:  Normal auditory acuity. Neck:  Supple; no masses or thyromegaly. Lungs:  Respirations even and unlabored.  Clear throughout to auscultation.   No wheezes, crackles, or rhonchi. No acute distress. Heart:  Regular rate and rhythm; no murmurs, clicks, rubs, or gallops. Abdomen:  Normal bowel sounds.  No bruits.  Soft, non-tender and non-distended without masses, but hepatomegaly was noted without any hernias noted.   No guarding or rebound tenderness.  Negative Carnett sign.   Rectal:  Deferred.  Pulses:  Normal pulses noted. Extremities:  No clubbing or edema.  No cyanosis. Neurologic:  Alert and oriented x3;  grossly normal neurologically. Skin:  Intact without significant lesions or rashes.  No jaundice. Lymph Nodes:  No significant cervical adenopathy. Psych:  Alert and cooperative. Normal mood and affect.  Imaging Studies: US Abdomen Limited RUQ (LIVER/GB)  Result Date: 04/15/2021 CLINICAL DATA:  Elevated LFTs and bilirubin EXAM: ULTRASOUND ABDOMEN LIMITED RIGHT UPPER QUADRANT COMPARISON:  None. FINDINGS: Gallbladder: Surgically absent. Common bile duct: Diameter: 3.7 Liver: No focal lesion identified. Slightly nodular contour with coarse echotexture. Portal vein is patent on color Doppler imaging with normal direction of blood flow towards the liver. Other: None. IMPRESSION: Slightly nodular liver contour with coarse echotexture, findings are suggestive of cirrhosis. Electronically Signed   By: Yetta Glassman M.D.   On: 04/15/2021 09:48    Assessment and Plan:   Matthew Mercado is a 60 y.o. y/o male who comes in today with a long history of alcohol abuse at abnormal liver enzymes consistent with alcoholic hepatitis with imaging consistent with cirrhosis.  The patient has been told that he needs to stop drinking alcohol and he also needs to have lab sent off for his hepatitis C and he will be treated accordingly.  The patient will also need an upper endoscopy to rule out esophageal varices.  He had a colonoscopy in 2019 and is not due for another one at this time.  The patient will have his blood sent off for other possible causes for abnormal liver enzymes.  The patient will be notified with his results when they are back.  The patient's wife has also been told that she should be tested for hepatitis C due to his infection.  The patient has been explained the plan agrees with it.    [name],  MD. Marval Regal    Note: This dictation was prepared with Dragon dictation along with smaller phrase technology. Any transcriptional errors that result from this process are unintentional.

## 2021-04-19 LAB — HEPATIC FUNCTION PANEL
ALT: 103 IU/L — ABNORMAL HIGH (ref 0–44)
AST: 163 IU/L — ABNORMAL HIGH (ref 0–40)
Albumin: 3.6 g/dL — ABNORMAL LOW (ref 3.8–4.9)
Alkaline Phosphatase: 114 IU/L (ref 44–121)
Bilirubin Total: 2.1 mg/dL — ABNORMAL HIGH (ref 0.0–1.2)
Bilirubin, Direct: 1.45 mg/dL — ABNORMAL HIGH (ref 0.00–0.40)
Total Protein: 6 g/dL (ref 6.0–8.5)

## 2021-04-19 LAB — HCV FIBROSURE
ALPHA 2-MACROGLOBULINS, QN: 332 mg/dL — ABNORMAL HIGH (ref 110–276)
ALT (SGPT) P5P: 121 IU/L — ABNORMAL HIGH (ref 0–55)
Apolipoprotein A-1: 145 mg/dL (ref 101–178)
Bilirubin, Total: 1.5 mg/dL — ABNORMAL HIGH (ref 0.0–1.2)
Fibrosis Score: 0.96 — ABNORMAL HIGH (ref 0.00–0.21)
GGT: 149 IU/L — ABNORMAL HIGH (ref 0–65)
Haptoglobin: <10 mg/dL — ABNORMAL LOW (ref 29–370)
Necroinflammat Activity Score: 0.83 — ABNORMAL HIGH (ref 0.00–0.17)

## 2021-04-19 LAB — HCV RNA QUANT
HCV log10: 6.618 log10 IU/mL
Hepatitis C Quantitation: 4150000 IU/mL

## 2021-04-19 LAB — HEPATITIS C ANTIBODY: Hep C Virus Ab: >11 s/co ratio — ABNORMAL HIGH (ref 0.0–0.9)

## 2021-04-19 LAB — ANA: Anti Nuclear Antibody (ANA): NEGATIVE

## 2021-04-19 LAB — ALPHA-1-ANTITRYPSIN: A-1 Antitrypsin: 203 mg/dL — ABNORMAL HIGH (ref 101–187)

## 2021-04-19 LAB — ANTI-SMOOTH MUSCLE ANTIBODY, IGG: Smooth Muscle Ab: 11 Units (ref 0–19)

## 2021-04-19 LAB — MITOCHONDRIAL ANTIBODIES: Mitochondrial Ab: <20 Units (ref 0.0–20.0)

## 2021-04-19 LAB — CERULOPLASMIN: Ceruloplasmin: 27 mg/dL (ref 16.0–31.0)

## 2021-04-21 ENCOUNTER — Telehealth: Payer: Self-pay | Admitting: Gastroenterology

## 2021-04-21 ENCOUNTER — Ambulatory Visit: Payer: 59 | Admitting: Licensed Clinical Social Worker

## 2021-04-21 NOTE — Telephone Encounter (Signed)
Pt. Calling to see when he is supposed to come in to get his next shot. He is requesting a call back

## 2021-04-23 NOTE — Telephone Encounter (Signed)
Returned pt's call regarding his next Twinrix injection. Pt scheduled for Tuesday, Nov 22nd.

## 2021-04-23 NOTE — Chronic Care Management (AMB) (Signed)
Care Management Clinical Social Work Note  04/23/2021 Name: Matthew Mercado MRN: [id number] DOB: [birth year]  Matthew Mercado is a 60 y.o. year old male who is a primary care patient of Vigg, Avanti, MD.  The Care Management team was consulted for assistance with chronic disease management and coordination needs.  Engaged with patient by telephone for initial visit in response to provider referral for social work chronic care management and care coordination services  Consent to Services:  Matthew Mercado was given information about Care Management services today including:  Care Management services includes personalized support from designated clinical staff supervised by his physician, including individualized plan of care and coordination with other care providers 24/7 contact phone numbers for assistance for urgent and routine care needs. The patient may stop case management services at any time by phone call to the office staff.  Patient agreed to services and consent obtained.   Consent to Services:  The patient was given information about Care Management services, agreed to services, and gave verbal consent prior to initiation of services.  Please see initial visit note for detailed documentation.   Patient agreed to services today and consent obtained.  Engaged with patient by phone in response to provider referral for social work care coordination services:  Assessment/Interventions: Assessed patient's previous and current treatment, coping skills, support system and barriers to care. He has maintained sobriety for a week with strong support from friends and family. Protective factors discussed and strategies to assist with management identified. Patient was not interested in supportive resources, at this time.  See Care Plan below for interventions and patient self-care activities.  Recent life changes or stressors: Management of health conditions  Recommendation: Patient may  benefit from, and is in agreement work with LCSW to address care coordination needs and will continue to work with the clinical team to address health care and disease management related needs.  Follow up Plan: Patient would like continued follow-up from CCM LCSW .  per patient's request will follow up in 06/12/21.  Will call office if needed prior to next encounter.  SDOH (Social Determinants of Health) assessments and interventions performed: NA  SDOH Interventions    Flowsheet Row Most Recent Value  SDOH Interventions   Food Insecurity Interventions Intervention Not Indicated  Financial Strain Interventions Intervention Not Indicated  Housing Interventions Intervention Not Indicated  Stress Interventions Intervention Not Indicated  Transportation Interventions Intervention Not Indicated        Advanced Directives Status: Not addressed in this encounter.  Care Plan  No Known Allergies  Outpatient Encounter Medications as of 04/21/2021  Medication Sig   benzocaine (ORAJEL) 10 % mucosal gel Use as directed 1 application in the mouth or throat as needed for mouth pain. (Patient not taking: Reported on 04/17/2021)   cetirizine (ZYRTEC) 10 MG tablet Take 10 mg by mouth daily.   Multiple Vitamins-Minerals (MENS MULTIVITAMIN PO) Take by mouth.   RIBOFLAVIN PO Take by mouth.   No facility-administered encounter medications on file as of 04/21/2021.    Patient Active Problem List   Diagnosis Date Noted   Cirrhosis of liver without ascites ([place]) 04/15/2021   Annual physical exam 04/02/2021   Elevated LFTs 04/02/2021   Itching 04/02/2021   Hyperbilirubinemia 04/02/2021   ETOH abuse 04/02/2021   Influenza A 08/01/2018   Lateral epicondylitis, right elbow 05/04/2018   Encounter for screening colonoscopy    Benign neoplasm of descending colon    Polyp of sigmoid colon  Elevated glucose 10/20/2017   Leg pain, anterior, left 10/20/2017    Conditions to be addressed/monitored:  Substance abuse issues -  Alcohol  Care Plan : LCSW Plan of Care  Updates made by Rebekah Chesterfield, LCSW since 04/23/2021 12:00 AM     Problem: Coping Skills (General Plan of Care)      Goal: Coping Skills Enhanced   Start Date: 04/21/2021  This Visit's Progress: On track  Priority: High  Note:   Current barriers:   Alcohol Use Disorder Substance abuse issues -  Alcohol Needs Support, Education, and Care Coordination in order to meet unmet mental health needs. Clinical Goal(s): verbalize understanding of plan for management of Alcohol Use Disorder   Clinical Interventions:  Assessed patient's previous and current treatment, coping skills, support system and barriers to care Patient reports hx of ongoing alcohol use. He has maintained sobriety of alcohol for one week, stating he has quit a "number of times" in the past. Patient denies depression and anxiety symptoms Patient's motivation to maintain sobriety includes managing chronic health condition and wanting to be available for grandchildren. Patient receives strong support from spouse, who has agreed to remain sober from alcohol, as well Patient is looking forward to completing Hep C treatment, which starts next week  Patient enjoys participating in Sunday school and spending time with friends from church members. He continues to work full time  Depression screen reviewed  Mindfulness or Relaxation training provided Active listening / Reflection utilized  Emotional Support Provided Provided psychoeducation for mental health needs  Participation in support group encouraged  Increase in actives / exercise encouraged  Verbalization of feelings encouraged  ; Review various resources, discussed options and provided patient information about  Options for supportive resources to assist with maintaining sobriety 1:1 collaboration with primary care provider regarding development and update of comprehensive plan of care as evidenced by  provider attestation and co-signature Inter-disciplinary care team collaboration (see longitudinal plan of care) Patient Goals/Self-Care Activities: Over the next 120 days Increase coping skills, healthy habits, and self-management skills Attend scheduled appointments with providers Contact PCP office with any questions or concerns        Christa See, MSW, [email or web address] Phone [phone] 5:49 PM

## 2021-04-23 NOTE — Patient Instructions (Signed)
Visit Information   Goals Addressed             This Visit's Progress    Management of Symptoms   On track    Timeframe:  Long-Range Goal Priority:  High Start Date:        04/18/22                     Expected End Date:    07/29/21                   Follow Up Date 06/12/21   Patient Goals/Self-Care Activities: Over the next 120 days Increase coping skills, healthy habits, and self-management skills Attend scheduled appointments with providers Contact PCP office with any questions or concerns        Patient verbalizes understanding of instructions provided today and agrees to view in [place].   Telephone follow up appointment with care management team member scheduled for:06/12/21  Christa See, MSW, [email or web address] Phone [phone] 5:54 PM

## 2021-04-25 ENCOUNTER — Telehealth: Payer: Self-pay

## 2021-04-25 NOTE — Telephone Encounter (Signed)
Pt notified of lab results

## 2021-04-25 NOTE — Telephone Encounter (Signed)
-----   Message from [name], MD sent at 04/24/2021  4:41 PM EDT ----- The patient know that the blood work showed his liver enzymes to continue to be significantly elevated consistent with alcohol use and the Blood work showed that he does have advanced Liver disease including cirrhosis.  He will need to be treated for his hepatitis C with his medication choice keeping in mind his cirrhosis.

## 2021-05-05 ENCOUNTER — Other Ambulatory Visit: Payer: Self-pay

## 2021-05-05 ENCOUNTER — Other Ambulatory Visit (HOSPITAL_COMMUNITY): Payer: Self-pay

## 2021-05-05 MED ORDER — SOFOSBUVIR-VELPATASVIR 400-100 MG PO TABS
1.0000 | ORAL_TABLET | Freq: Every day | ORAL | 2 refills | Status: DC
  Filled 2021-05-05 – 2021-05-06 (×2): qty 28, 28d supply, fill #0

## 2021-05-06 ENCOUNTER — Other Ambulatory Visit: Payer: Self-pay

## 2021-05-06 ENCOUNTER — Telehealth: Payer: Self-pay

## 2021-05-06 ENCOUNTER — Ambulatory Visit (INDEPENDENT_AMBULATORY_CARE_PROVIDER_SITE_OTHER): Payer: 59 | Admitting: Pharmacist

## 2021-05-06 ENCOUNTER — Other Ambulatory Visit (HOSPITAL_COMMUNITY): Payer: Self-pay

## 2021-05-06 ENCOUNTER — Other Ambulatory Visit: Payer: Self-pay | Admitting: Pharmacist

## 2021-05-06 DIAGNOSIS — B182 Chronic viral hepatitis C: Secondary | ICD-10-CM

## 2021-05-06 MED ORDER — EPCLUSA 400-100 MG PO TABS
1.0000 | ORAL_TABLET | Freq: Every day | ORAL | 2 refills | Status: DC
  Filled 2021-05-06 (×4): qty 28, 28d supply, fill #0
  Filled 2021-05-29: qty 28, 28d supply, fill #1
  Filled 2021-06-20: qty 28, 28d supply, fill #2

## 2021-05-06 NOTE — Telephone Encounter (Signed)
[facility] Patient Advocate Encounter   Was successful in obtaining a [place] copay card for [employer].  This copay card will make the patients copay 5.00.  I have spoken with the patient.    The billing information is as follows and has been shared with [place].      Ileene Patrick, [facility] Patient [facility] for Infectious Disease Phone: [phone] Fax:  [phone]

## 2021-05-06 NOTE — Progress Notes (Signed)
Virtual Visit via Telephone Note  I connected with Matthew Mercado on 05/06/21 at 11:30 AM EST by telephone and verified that I am speaking with the correct person using two identifiers.  Location: Patient's Wife: Work Provider: Office   I discussed the limitations, risks, security and privacy concerns of performing an evaluation and management service by telephone and the availability of in person appointments. I also discussed with the patient that there may be a patient responsible charge related to this service. The patient expressed understanding and agreed to proceed.  HPI: Matthew Mercado is a 60 y.o. male who presents for follow-up of their specialty medication, Epclusa.  Patient Active Problem List   Diagnosis Date Noted   Cirrhosis of liver without ascites ([place]) 04/15/2021   Annual physical exam 04/02/2021   Elevated LFTs 04/02/2021   Itching 04/02/2021   Hyperbilirubinemia 04/02/2021   ETOH abuse 04/02/2021   Influenza A 08/01/2018   Lateral epicondylitis, right elbow 05/04/2018   Encounter for screening colonoscopy    Benign neoplasm of descending colon    Polyp of sigmoid colon    Elevated glucose 10/20/2017   Leg pain, anterior, left 10/20/2017    Patient's Medications  New Prescriptions   EPCLUSA 400-100 MG TABS    Take 1 tablet by mouth daily.  Previous Medications   BENZOCAINE (ORAJEL) 10 % MUCOSAL GEL    Use as directed 1 application in the mouth or throat as needed for mouth pain.   CETIRIZINE (ZYRTEC) 10 MG TABLET    Take 10 mg by mouth daily.   MULTIPLE VITAMINS-MINERALS (MENS MULTIVITAMIN PO)    Take by mouth.   RIBOFLAVIN PO    Take by mouth.  Modified Medications   No medications on file  Discontinued Medications   No medications on file    Allergies: No Known Allergies  Past Medical History: Past Medical History:  Diagnosis Date   Cervical spine degeneration    Depression     Social History: Social History   Socioeconomic History    Marital status: Married    Spouse name: Not on file   Number of children: Not on file   Years of education: Not on file   Highest education level: Not on file  Occupational History   Not on file  Tobacco Use   Smoking status: Former    Types: Cigarettes    Quit date: 11/12/2010    Years since quitting: 10.4   Smokeless tobacco: Never  Vaping Use   Vaping Use: Never used  Substance and Sexual Activity   Alcohol use: Yes    Comment: 6 to 10 beers daily   Drug use: No    Comment: Used in the past   Sexual activity: Yes  Other Topics Concern   Not on file  Social History Narrative   Not on file   Social Determinants of Health   Financial Resource Strain: Low Risk    Difficulty of Paying Living Expenses: Not hard at all  Food Insecurity: No Food Insecurity   Worried About Charity fundraiser in the Last Year: Never true   [place] in the Last Year: Never true  Transportation Needs: No Transportation Needs   Lack of Transportation (Medical): No   Lack of Transportation (Non-Medical): No  Physical Activity: Not on file  Stress: No Stress Concern Present   Feeling of Stress : Not at all  Social Connections: Not on file    Labs: No results found  for: HIV1RNAQUANT, HIV1RNAVL, CD4TABS  RPR and STI No results found for: LABRPR, RPRTITER  No flowsheet data found.  Hepatitis B No results found for: HEPBSAB, HEPBSAG, HEPBCAB Hepatitis C No results found for: HEPCAB, HCVRNAPCRQN Hepatitis A Lab Results  Component Value Date   HAV Negative 04/02/2021   Lipids: Lab Results  Component Value Date   CHOL 136 03/24/2021   TRIG 70 03/24/2021   HDL 58 03/24/2021   CHOLHDL 2.3 03/24/2021   LDLCALC 64 03/24/2021    Assessment: I spoke with patient's wife today regarding their specialty medication, Epclusa. Patient will start treatment with Epclusa for 12 weeks once he receives it from [facility]. Counseled to take Epclusa daily with or without  food. Encouraged not to miss any doses and explained how chance of cure could go down with each dose missed. Counseled on what to do if dose is missed - if it is closer to the missed dose take immediately; if closer to next dose skip dose and take the next dose at the usual time.   Counseled on common side effects such as headache, fatigue, and nausea and that these normally decrease with time. I reviewed patient medications and found no drug interactions. Discussed that there are several drug interactions including acid suppressants.   Plan: - Start Epclusa for 12 weeks - No follow up needed with me as this is only a 3 month course of treatment   I discussed the assessment and treatment plan with the patient. The patient was provided an opportunity to ask questions and all were answered. The patient agreed with the plan and demonstrated an understanding of the instructions.   The patient was advised to call back or seek an in-person evaluation if the symptoms worsen or if the condition fails to improve as anticipated.  I provided 15 minutes of non-face-to-face time during this encounter.  Nelwyn Hebdon L. Aliegha Paullin, PharmD, BCIDP, [place], CPP Clinical Pharmacist Practitioner Infectious Diseases [facility] 05/06/2021, 4:20 PM

## 2021-05-07 ENCOUNTER — Other Ambulatory Visit (HOSPITAL_COMMUNITY): Payer: Self-pay

## 2021-05-07 ENCOUNTER — Encounter: Payer: Self-pay | Admitting: Gastroenterology

## 2021-05-14 ENCOUNTER — Ambulatory Visit: Payer: 59 | Admitting: Internal Medicine

## 2021-05-19 ENCOUNTER — Ambulatory Visit (INDEPENDENT_AMBULATORY_CARE_PROVIDER_SITE_OTHER): Payer: 59 | Admitting: Internal Medicine

## 2021-05-19 ENCOUNTER — Other Ambulatory Visit: Payer: Self-pay

## 2021-05-19 ENCOUNTER — Ambulatory Visit
Admission: RE | Admit: 2021-05-19 | Discharge: 2021-05-19 | Disposition: A | Discharge status: Home / Self Care | Payer: 59 | Source: Ambulatory Visit | Attending: Internal Medicine | Admitting: Internal Medicine

## 2021-05-19 ENCOUNTER — Encounter: Payer: Self-pay | Admitting: Internal Medicine

## 2021-05-19 ENCOUNTER — Ambulatory Visit
Admission: RE | Admit: 2021-05-19 | Discharge: 2021-05-19 | Disposition: A | Discharge status: Home / Self Care | Payer: 59 | Attending: Internal Medicine | Admitting: Internal Medicine

## 2021-05-19 VITALS — BP 133/83 | HR 90 | Temp 98.7°F | Ht 70.0 in | Wt 211.0 lb

## 2021-05-19 DIAGNOSIS — M25561 Pain in right knee: Secondary | ICD-10-CM

## 2021-05-19 DIAGNOSIS — J029 Acute pharyngitis, unspecified: Secondary | ICD-10-CM | POA: Diagnosis not present

## 2021-05-19 DIAGNOSIS — M79642 Pain in left hand: Secondary | ICD-10-CM

## 2021-05-19 NOTE — Progress Notes (Signed)
BP 133/83   Pulse 90   Temp 98.7 F (37.1 C) (Oral)   Ht 5\' 10"  (1.778 m)   Wt 211 lb (95.7 kg)   SpO2 97%   BMI 30.28 kg/m    Subjective:    Patient ID: Matthew Mercado, male    DOB: 1961/06/17, 60 y.o.   MRN: [id number]  Chief Complaint  Patient presents with  . Hoarse    Patient states that he has been having a scratchy throat and hoarse for the past week.   . Knee Pain    Right knee pain for the past 3 week, states that it gets worse at night.   . Thumb pain    Right thumb pain for the past 3 to 4 days.     HPI: Matthew Mercado is a 61 y.o. male  Started rx for hepatiits C was seen by Dr. Allen Norris for such 71 day tx for such hasnt had a adrink x 5 wekes to have an EGD as well for varices. Pt has a ho IVDA x early 20's  Is on an antiviral   Knee Pain  Incident onset: x 3 weeks ago, has noticed some pain and sweling. Pertinent negatives include no numbness.   Chief Complaint  Patient presents with  . Hoarse    Patient states that he has been having a scratchy throat and hoarse for the past week.   . Knee Pain    Right knee pain for the past 3 week, states that it gets worse at night.   . Thumb pain    Right thumb pain for the past 3 to 4 days.     Relevant past medical, surgical, family and social history reviewed and updated as indicated. Interim medical history since our last visit reviewed. Allergies and medications reviewed and updated.  Review of Systems  Constitutional:  Negative for activity change, appetite change, chills, diaphoresis, fatigue, fever and unexpected weight change.  HENT:  Negative for congestion and dental problem.   Eyes:  Negative for photophobia, pain, redness and visual disturbance.  Respiratory:  Negative for apnea, cough, choking, chest tightness, shortness of breath and stridor.   Gastrointestinal:  Negative for abdominal distention, abdominal pain, anal bleeding, blood in stool, diarrhea and nausea.  Endocrine: Negative for  cold intolerance, heat intolerance, polydipsia and polyphagia.  Genitourinary:  Negative for difficulty urinating, dysuria, enuresis and testicular pain.  Musculoskeletal:  Positive for arthralgias and joint swelling. Negative for back pain, gait problem, myalgias, neck pain and neck stiffness.  Skin:  Positive for color change.       Icterus noted   Neurological:  Negative for dizziness, facial asymmetry, light-headedness, numbness and headaches.  Psychiatric/Behavioral:  Negative for agitation and confusion.    Per HPI unless specifically indicated above     Objective:    BP 133/83   Pulse 90   Temp 98.7 F (37.1 C) (Oral)   Ht 5\' 10"  (1.778 m)   Wt 211 lb (95.7 kg)   SpO2 97%   BMI 30.28 kg/m   Wt Readings from Last 3 Encounters:  05/19/21 211 lb (95.7 kg)  04/17/21 207 lb (93.9 kg)  04/15/21 209 lb (94.8 kg)    Physical Exam Vitals and nursing note reviewed.  Constitutional:      General: He is not in acute distress.    Appearance: Normal appearance. He is not ill-appearing or diaphoretic.  HENT:     Head: Normocephalic and atraumatic.  Right Ear: Tympanic membrane and external ear normal. There is no impacted cerumen.     Left Ear: External ear normal.     Nose: No congestion or rhinorrhea.     Mouth/Throat:     Pharynx: No oropharyngeal exudate or posterior oropharyngeal erythema.  Eyes:     Conjunctiva/sclera: Conjunctivae normal.     Pupils: Pupils are equal, round, and reactive to light.  Cardiovascular:     Rate and Rhythm: Normal rate and regular rhythm.     Heart sounds: No murmur heard.   No friction rub. No gallop.  Pulmonary:     Effort: No respiratory distress.     Breath sounds: No stridor. No wheezing or rhonchi.  Chest:     Chest wall: No tenderness.  Abdominal:     General: Abdomen is flat. Bowel sounds are normal.     Palpations: Abdomen is soft. There is no mass.     Tenderness: There is no abdominal tenderness.  Musculoskeletal:      Cervical back: Normal range of motion and neck supple. No rigidity or tenderness.     Left lower leg: No edema.  Skin:    General: Skin is warm and dry.  Neurological:     Mental Status: He is alert.    Results for orders placed or performed in visit on 04/17/21  Ceruloplasmin  Result Value Ref Range   Ceruloplasmin 27.0 16.0 - 31.0 mg/dL  Mitochondrial antibodies  Result Value Ref Range   Mitochondrial Ab <20.0 0.0 - 20.0 Units  Anti-smooth muscle antibody, IgG  Result Value Ref Range   Smooth Muscle Ab 11 0 - 19 Units  Alpha-1-antitrypsin  Result Value Ref Range   A-1 Antitrypsin 203 (H) 101 - 187 mg/dL  Hepatic function panel  Result Value Ref Range   Total Protein 6.0 6.0 - 8.5 g/dL   Albumin 3.6 (L) 3.8 - 4.9 g/dL   Bilirubin Total 2.1 (H) 0.0 - 1.2 mg/dL   Bilirubin, Direct 1.45 (H) 0.00 - 0.40 mg/dL   Alkaline Phosphatase 114 44 - 121 IU/L   AST 163 (H) 0 - 40 IU/L   ALT 103 (H) 0 - 44 IU/L  ANA  Result Value Ref Range   Anti Nuclear Antibody (ANA) Negative Negative  HCV FibroSURE  Result Value Ref Range   Fibrosis Score 0.96 (H) 0.00 - 0.21   Fibrosis Stage Comment    Necroinflammat Activity Score 0.83 (H) 0.00 - 0.17   Necroinflammat Activity Grade A3-Severe activity    ALPHA 2-MACROGLOBULINS, QN 332 (H) 110 - 276 mg/dL   Haptoglobin <10 (L) 29 - 370 mg/dL   Apolipoprotein A-1 145 101 - 178 mg/dL   Bilirubin, Total 1.5 (H) 0.0 - 1.2 mg/dL   GGT 149 (H) 0 - 65 IU/L   ALT (SGPT) P5P 121 (H) 0 - 55 IU/L   Interpretations: Comment    Fibrosis Scoring: Comment    Necroinflamm Activity Scoring: Comment    Limitations: Comment    Comment Comment   HCV RNA quant  Result Value Ref Range   Hepatitis C Quantitation 4,150,000 IU/mL   HCV log10 6.618 log10 IU/mL   Test Information Comment   Hepatitis C antibody  Result Value Ref Range   Hep C Virus Ab >11.0 (H) 0.0 - 0.9 s/co ratio        Current Outpatient Medications:  .  cetirizine (ZYRTEC) 10 MG tablet,  Take 10 mg by mouth daily., Disp: , Rfl:  .  EPCLUSA 400-100 MG TABS, Take 1 tablet by mouth daily., Disp: 28 tablet, Rfl: 2 .  Multiple Vitamins-Minerals (MENS MULTIVITAMIN PO), Take by mouth., Disp: , Rfl:  .  RIBOFLAVIN PO, Take by mouth., Disp: , Rfl:  .  benzocaine (ORAJEL) 10 % mucosal gel, Use as directed 1 application in the mouth or throat as needed for mouth pain. (Patient not taking: Reported on 05/19/2021), Disp: 5.3 g, Rfl: 0   US LIVER:  Slightly nodular liver contour with coarse echotexture, findings are suggestive of cirrhosis. Assessment & Plan:  Right knee pain :  - check  RA vs Gout CHeck RF/ ANA / uric acid.  Will need to fu with ortho for such.  pt takes pain meds for such.   2. Hepatitis C C genotype Ia. +ve / cirrhosis sec to ETOH and IVDA in the past.  is seeing Dr. Allen Norris for such, is on antivirals for suhc - epclusa  To have an EGD for the same  Fu and mx per GI   Problem List Items Addressed This Visit   None Visit Diagnoses     Sore throat    -  Primary   Relevant Orders   Rapid Strep screen([facility])   Veritor Flu A/B Waived        Orders Placed This Encounter  Procedures  . Rapid Strep screen([facility])  . Veritor Flu A/B Waived     No orders of the defined types were placed in this encounter.    Follow up plan: No follow-ups on file.

## 2021-05-20 ENCOUNTER — Telehealth: Payer: Self-pay

## 2021-05-20 ENCOUNTER — Ambulatory Visit (INDEPENDENT_AMBULATORY_CARE_PROVIDER_SITE_OTHER): Payer: 59 | Admitting: Gastroenterology

## 2021-05-20 DIAGNOSIS — M25561 Pain in right knee: Secondary | ICD-10-CM

## 2021-05-20 DIAGNOSIS — Z23 Encounter for immunization: Secondary | ICD-10-CM

## 2021-05-20 DIAGNOSIS — Z9229 Personal history of other drug therapy: Secondary | ICD-10-CM

## 2021-05-20 NOTE — Progress Notes (Signed)
Pl call pt and let him know he will need to see ortho for furhter wu and mx, xrays look normal.

## 2021-05-20 NOTE — Telephone Encounter (Signed)
I believe I had done this at his last visit.

## 2021-05-20 NOTE — Telephone Encounter (Signed)
Patient wanted to be referred to Ortho. Referral tee'd up for your signature

## 2021-05-21 LAB — COMPREHENSIVE METABOLIC PANEL
ALT: 31 IU/L (ref 0–44)
AST: 45 IU/L — ABNORMAL HIGH (ref 0–40)
Albumin/Globulin Ratio: 1.3 (ref 1.2–2.2)
Albumin: 3.6 g/dL — ABNORMAL LOW (ref 3.8–4.9)
Alkaline Phosphatase: 81 IU/L (ref 44–121)
BUN/Creatinine Ratio: 13 (ref 9–20)
BUN: 11 mg/dL (ref 6–24)
Bilirubin Total: 1 mg/dL (ref 0.0–1.2)
CO2: 24 mmol/L (ref 20–29)
Calcium: 9 mg/dL (ref 8.7–10.2)
Chloride: 106 mmol/L (ref 96–106)
Creatinine, Ser: 0.84 mg/dL (ref 0.76–1.27)
Globulin, Total: 2.7 g/dL (ref 1.5–4.5)
Glucose: 80 mg/dL (ref 70–99)
Potassium: 4.2 mmol/L (ref 3.5–5.2)
Sodium: 143 mmol/L (ref 134–144)
Total Protein: 6.3 g/dL (ref 6.0–8.5)
eGFR: 100 mL/min/{1.73_m2} (ref >59)

## 2021-05-21 LAB — CBC WITH DIFFERENTIAL/PLATELET
Basophils Absolute: 0.1 10*3/uL (ref 0.0–0.2)
Basos: 1 %
EOS (ABSOLUTE): 0.6 10*3/uL — ABNORMAL HIGH (ref 0.0–0.4)
Eos: 8 %
Hematocrit: 40.5 % (ref 37.5–51.0)
Hemoglobin: 14.4 g/dL (ref 13.0–17.7)
Immature Grans (Abs): 0 10*3/uL (ref 0.0–0.1)
Immature Granulocytes: 0 %
Lymphocytes Absolute: 2 10*3/uL (ref 0.7–3.1)
Lymphs: 30 %
MCH: 34 pg — ABNORMAL HIGH (ref 26.6–33.0)
MCHC: 35.6 g/dL (ref 31.5–35.7)
MCV: 96 fL (ref 79–97)
Monocytes Absolute: 0.9 10*3/uL (ref 0.1–0.9)
Monocytes: 13 %
Neutrophils Absolute: 3.2 10*3/uL (ref 1.4–7.0)
Neutrophils: 48 %
Platelets: 178 10*3/uL (ref 150–450)
RBC: 4.23 x10E6/uL (ref 4.14–5.80)
RDW: 11.9 % (ref 11.6–15.4)
WBC: 6.8 10*3/uL (ref 3.4–10.8)

## 2021-05-21 LAB — ROCKY MTN SPOTTED FVR ABS PNL(IGG+IGM)
RMSF IgG: NEGATIVE
RMSF IgM: 0.92 index — ABNORMAL HIGH (ref 0.00–0.89)

## 2021-05-21 LAB — URIC ACID: Uric Acid: 3.8 mg/dL (ref 3.8–8.4)

## 2021-05-21 LAB — ANA: Anti Nuclear Antibody (ANA): NEGATIVE

## 2021-05-21 LAB — BABESIA MICROTI ANTIBODY PANEL
Babesia microti IgG: <1:10 {titer}
Babesia microti IgM: <1:10 {titer}

## 2021-05-21 LAB — EHRLICHIA ANTIBODY PANEL
E. Chaffeensis (HME) IgM Titer: NEGATIVE
E.Chaffeensis (HME) IgG: NEGATIVE
HGE IgG Titer: NEGATIVE
HGE IgM Titer: NEGATIVE

## 2021-05-21 LAB — RHEUMATOID FACTOR: Rheumatoid fact SerPl-aCnc: 10.6 IU/mL (ref <14.0)

## 2021-05-23 LAB — VERITOR FLU A/B WAIVED
Influenza A: NEGATIVE
Influenza B: NEGATIVE

## 2021-05-23 LAB — CULTURE, GROUP A STREP: Strep A Culture: NEGATIVE

## 2021-05-23 LAB — RAPID STREP SCREEN (MED CTR MEBANE ONLY): Strep Gp A Ag, IA W/Reflex: NEGATIVE

## 2021-05-26 ENCOUNTER — Other Ambulatory Visit: Payer: Self-pay

## 2021-05-26 ENCOUNTER — Encounter
Admission: RE | Disposition: A | Discharge status: Home / Self Care | Payer: Self-pay | Source: Home / Self Care | Attending: Gastroenterology

## 2021-05-26 ENCOUNTER — Ambulatory Visit: Payer: 59 | Admitting: Anesthesiology

## 2021-05-26 ENCOUNTER — Other Ambulatory Visit (HOSPITAL_COMMUNITY): Payer: Self-pay

## 2021-05-26 ENCOUNTER — Encounter: Payer: Self-pay | Admitting: Gastroenterology

## 2021-05-26 ENCOUNTER — Ambulatory Visit
Admission: RE | Admit: 2021-05-26 | Discharge: 2021-05-26 | Disposition: A | Discharge status: Home / Self Care | Payer: 59 | Attending: Gastroenterology | Admitting: Gastroenterology

## 2021-05-26 DIAGNOSIS — B1920 Unspecified viral hepatitis C without hepatic coma: Secondary | ICD-10-CM | POA: Insufficient documentation

## 2021-05-26 DIAGNOSIS — I864 Gastric varices: Secondary | ICD-10-CM

## 2021-05-26 DIAGNOSIS — Z87891 Personal history of nicotine dependence: Secondary | ICD-10-CM | POA: Insufficient documentation

## 2021-05-26 DIAGNOSIS — K7030 Alcoholic cirrhosis of liver without ascites: Secondary | ICD-10-CM

## 2021-05-26 DIAGNOSIS — I8500 Esophageal varices without bleeding: Secondary | ICD-10-CM

## 2021-05-26 DIAGNOSIS — K7460 Unspecified cirrhosis of liver: Secondary | ICD-10-CM | POA: Diagnosis not present

## 2021-05-26 DIAGNOSIS — K3189 Other diseases of stomach and duodenum: Secondary | ICD-10-CM | POA: Diagnosis not present

## 2021-05-26 DIAGNOSIS — K766 Portal hypertension: Secondary | ICD-10-CM | POA: Diagnosis not present

## 2021-05-26 HISTORY — PX: ESOPHAGOGASTRODUODENOSCOPY (EGD) WITH PROPOFOL: SHX5813

## 2021-05-26 HISTORY — DX: Unspecified viral hepatitis C without hepatic coma: B19.20

## 2021-05-26 SURGERY — ESOPHAGOGASTRODUODENOSCOPY (EGD) WITH PROPOFOL
Anesthesia: General | Site: Throat

## 2021-05-26 MED ORDER — STERILE WATER FOR IRRIGATION IR SOLN
Status: DC | PRN
  Administered 2021-05-26: 1

## 2021-05-26 MED ORDER — ONDANSETRON HCL 4 MG/2ML IJ SOLN: 4.0000 mg | Freq: Once | INTRAMUSCULAR | Status: DC | PRN

## 2021-05-26 MED ORDER — ACETAMINOPHEN 160 MG/5ML PO SOLN: 325.0000 mg | ORAL | Status: DC | PRN

## 2021-05-26 MED ORDER — PROPOFOL 10 MG/ML IV BOLUS
INTRAVENOUS | Status: DC | PRN
  Administered 2021-05-26: 100 mg via INTRAVENOUS
  Administered 2021-05-26: 50 mg via INTRAVENOUS

## 2021-05-26 MED ORDER — SODIUM CHLORIDE 0.9 % IV SOLN: INTRAVENOUS | Status: DC

## 2021-05-26 MED ORDER — DOXYCYCLINE HYCLATE 100 MG PO TABS: 100.0000 mg | ORAL_TABLET | Freq: Two times a day (BID) | ORAL | 0 refills | Status: AC

## 2021-05-26 MED ORDER — LACTATED RINGERS IV SOLN: INTRAVENOUS | Status: DC

## 2021-05-26 MED ORDER — LIDOCAINE HCL (CARDIAC) PF 100 MG/5ML IV SOSY
PREFILLED_SYRINGE | INTRAVENOUS | Status: DC | PRN
  Administered 2021-05-26: 30 mg via INTRAVENOUS

## 2021-05-26 MED ORDER — ACETAMINOPHEN 325 MG PO TABS: 325.0000 mg | ORAL_TABLET | ORAL | Status: DC | PRN

## 2021-05-26 MED ORDER — GLYCOPYRROLATE 0.2 MG/ML IJ SOLN
INTRAMUSCULAR | Status: DC | PRN
  Administered 2021-05-26: .2 mg via INTRAVENOUS

## 2021-05-26 SURGICAL SUPPLY — 7 items
BLOCK BITE 60FR ADLT L/F GRN (MISCELLANEOUS) ×2 IMPLANT
GOWN CVR UNV OPN BCK APRN NK (MISCELLANEOUS) ×2 IMPLANT
GOWN ISOL THUMB LOOP REG UNIV (MISCELLANEOUS) ×4
KIT PRC NS LF DISP ENDO (KITS) ×1 IMPLANT
KIT PROCEDURE OLYMPUS (KITS) ×2
MANIFOLD NEPTUNE II (INSTRUMENTS) ×2 IMPLANT
WATER STERILE IRR 250ML POUR (IV SOLUTION) ×2 IMPLANT

## 2021-05-26 NOTE — Anesthesia Preprocedure Evaluation (Addendum)
Anesthesia Evaluation  Patient identified by MRN, date of birth, ID band Patient awake    Reviewed: Allergy & Precautions, NPO status   Airway Mallampati: II  TM Distance: >3 FB     Dental   Pulmonary former smoker,    Pulmonary exam normal        Cardiovascular negative cardio ROS   Rhythm:Regular Rate:Normal     Neuro/Psych PSYCHIATRIC DISORDERS Depression Hx ETOH abuse  Hx IVDU   GI/Hepatic (+) Cirrhosis  (without ascites)      , Hepatitis - (C - receiving treatment currently)  Endo/Other    Renal/GU      Musculoskeletal  (+) Arthritis ,   Abdominal   Peds  Hematology   Anesthesia Other Findings   Reproductive/Obstetrics                             Anesthesia Physical Anesthesia Plan  ASA: 3  Anesthesia Plan: General   Post-op Pain Management:    Induction: Intravenous  PONV Risk Score and Plan: Propofol infusion, TIVA and Treatment may vary due to age or medical condition  Airway Management Planned: Natural Airway and Nasal Cannula  Additional Equipment:   Intra-op Plan:   Post-operative Plan:   Informed Consent: I have reviewed the patients History and Physical, chart, labs and discussed the procedure including the risks, benefits and alternatives for the proposed anesthesia with the patient or authorized representative who has indicated his/her understanding and acceptance.       Plan Discussed with: CRNA  Anesthesia Plan Comments:        Anesthesia Quick Evaluation

## 2021-05-26 NOTE — Addendum Note (Signed)
Addended by: Charlynne Cousins on: 05/26/2021 09:08 AM   Modules accepted: Orders

## 2021-05-26 NOTE — Transfer of Care (Signed)
Immediate Anesthesia Transfer of Care Note  Patient: Matthew Mercado  Procedure(s) Performed: ESOPHAGOGASTRODUODENOSCOPY (EGD) WITH PROPOFOL (Throat)  Patient Location: PACU  Anesthesia Type: General  Level of Consciousness: awake, alert  and patient cooperative  Airway and Oxygen Therapy: Patient Spontanous Breathing and Patient connected to supplemental oxygen  Post-op Assessment: Post-op Vital signs reviewed, Patient's Cardiovascular Status Stable, Respiratory Function Stable, Patent Airway and No signs of Nausea or vomiting  Post-op Vital Signs: Reviewed and stable  Complications: No notable events documented.

## 2021-05-26 NOTE — Anesthesia Procedure Notes (Signed)
Procedure Name: MAC Date/Time: 05/26/2021 7:44 AM Performed by: Georga Bora, CRNA Pre-anesthesia Checklist: Patient identified, Emergency Drugs available, Suction available, Patient being monitored and Timeout performed Patient Re-evaluated:Patient Re-evaluated prior to induction Oxygen Delivery Method: Nasal cannula Placement Confirmation: positive ETCO2 and breath sounds checked- equal and bilateral

## 2021-05-26 NOTE — Anesthesia Postprocedure Evaluation (Signed)
Anesthesia Post Note  Patient: Matthew Mercado  Procedure(s) Performed: ESOPHAGOGASTRODUODENOSCOPY (EGD) WITH PROPOFOL (Throat)     Patient location during evaluation: PACU Anesthesia Type: General Level of consciousness: awake Pain management: pain level controlled Vital Signs Assessment: post-procedure vital signs reviewed and stable Respiratory status: respiratory function stable Cardiovascular status: stable Postop Assessment: no signs of nausea or vomiting Anesthetic complications: no   No notable events documented.  Veda Canning

## 2021-05-26 NOTE — Op Note (Signed)
[facility] Patient Name: Matthew Mercado Procedure Date: 05/26/2021 7:18 AM MRN: [id number] Account #: [id number] Date of Birth: March 03, 1961 Admit Type: Outpatient Age: 60 Room: [facility] OR ROOM 01 Gender: Male Note Status: Finalized Instrument Name: [id number] Procedure:             Upper GI endoscopy Indications:           Cirrhosis rule out esophageal varices Providers:             [name] MD, MD Medicines:             Propofol per Anesthesia Complications:         No immediate complications. Procedure:             Pre-Anesthesia Assessment:                        - Prior to the procedure, a History and Physical was                         performed, and patient medications and allergies were                         reviewed. The patient's tolerance of previous                         anesthesia was also reviewed. The risks and benefits                         of the procedure and the sedation options and risks                         were discussed with the patient. All questions were                         answered, and informed consent was obtained. Prior                         Anticoagulants: The patient has taken no previous                         anticoagulant or antiplatelet agents. ASA Grade                         Assessment: III - A patient with severe systemic                         disease. After reviewing the risks and benefits, the                         patient was deemed in satisfactory condition to                         undergo the procedure.                        After obtaining informed consent, the endoscope was                         passed under direct vision. Throughout the procedure,  the patient's blood pressure, pulse, and oxygen                         saturations were monitored continuously. The Endoscope                         was introduced through the mouth, and advanced to the                          second part of duodenum. The upper GI endoscopy was                         accomplished without difficulty. The patient tolerated                         the procedure well. Findings:      The examined esophagus was normal.      Type 1 isolated gastric varices (IGV1, varices located in the fundus)       with no bleeding were found in the gastric fundus. There were no       stigmata of recent bleeding.      Moderate portal hypertensive gastropathy was found in the entire       examined stomach.      The examined duodenum was normal. Impression:            - Normal esophagus.                        - Type 1 isolated gastric varices (IGV1, varices                         located in the fundus), without bleeding.                        - Portal hypertensive gastropathy.                        - Normal examined duodenum.                        - No specimens collected. Recommendation:        - Discharge patient to home.                        - Resume previous diet.                        - Continue present medications.                        - Return to my office in 3 weeks. Procedure Code(s):     --- Professional ---                        [id number], Esophagogastroduodenoscopy, flexible,                         transoral; diagnostic, including collection of                         specimen(s) by brushing or washing, when performed                         (  separate procedure) Diagnosis Code(s):     --- Professional ---                        K74.60, Unspecified cirrhosis of liver                        K76.6, Portal hypertension                        I86.4, Gastric varices CPT copyright 2019 American Medical Association. All rights reserved. The codes documented in this report are preliminary and upon coder review may  be revised to meet current compliance requirements. [name] MD, MD 05/26/2021 7:54:43 AM This report has been signed electronically. Number of  Addenda: 0 Note Initiated On: 05/26/2021 7:18 AM Total Procedure Duration: 0 hours 2 minutes 38 seconds  Estimated Blood Loss:  Estimated blood loss: none.      [facility]

## 2021-05-26 NOTE — H&P (Signed)
Matthew Lame, MD [address] [place], [place] [address] [phone] Fax : [phone]  Primary Care Physician:  Matthew Lame, MD Primary Gastroenterologist:  Dr. Allen Norris  Pre-Procedure History & Physical: HPI:  Matthew Mercado is a 60 y.o. male is here for an endoscopy.   Past Medical History:  Diagnosis Date   Cervical spine degeneration    Cirrhosis of liver without ascites ([place]) 04/15/2021   Depression    ETOH abuse 04/02/2021   Hepatitis C     Past Surgical History:  Procedure Laterality Date   CHOLECYSTECTOMY     COLONOSCOPY WITH PROPOFOL N/A 12/08/2017   Procedure: COLONOSCOPY WITH PROPOFOL;  Surgeon: Matthew Lame, MD;  Location: [facility];  Service: Endoscopy;  Laterality: N/A;   SPINE SURGERY     discectomy and fusion C 4/5   TONSILLECTOMY      Prior to Admission medications   Medication Sig Start Date End Date Taking? Authorizing Provider  cetirizine (ZYRTEC) 10 MG tablet Take 10 mg by mouth daily.   Yes [provider]  EPCLUSA 400-100 MG TABS Take 1 tablet by mouth daily. 05/06/21  Yes Kuppelweiser, Cassie L, RPH-CPP  Multiple Vitamins-Minerals (MENS MULTIVITAMIN PO) Take by mouth.   Yes [provider]  RIBOFLAVIN PO Take by mouth.   Yes [provider]  benzocaine (ORAJEL) 10 % mucosal gel Use as directed 1 application in the mouth or throat as needed for mouth pain. Patient not taking: Reported on 05/19/2021 03/14/21   Charlynne Cousins, MD    Allergies as of 04/17/2021   (No Known Allergies)    Family History  Problem Relation Age of Onset   Cancer Mother    Cancer Father        prostate   Hypertension Father    Sleep apnea Sister    Hypertension Sister    Drug abuse Sister     Social History   Socioeconomic History   Marital status: Married    Spouse name: Not on file   Number of children: Not on file   Years of education: Not on file   Highest education level: Not on file   Occupational History   Not on file  Tobacco Use   Smoking status: Former    Types: Cigarettes    Quit date: 11/12/2010    Years since quitting: 10.5   Smokeless tobacco: Never  Vaping Use   Vaping Use: Never used  Substance and Sexual Activity   Alcohol use: Not Currently   Drug use: No    Comment: Used in the past   Sexual activity: Yes  Other Topics Concern   Not on file  Social History Narrative   Not on file   Social Determinants of Health   Financial Resource Strain: Low Risk    Difficulty of Paying Living Expenses: Not hard at all  Food Insecurity: No Food Insecurity   Worried About Charity fundraiser in the Last Year: Never true   [place] in the Last Year: Never true  Transportation Needs: No Transportation Needs   Lack of Transportation (Medical): No   Lack of Transportation (Non-Medical): No  Physical Activity: Not on file  Stress: No Stress Concern Present   Feeling of Stress : Not at all  Social Connections: Not on file  Intimate Partner Violence: Not on file    Review of Systems: See HPI, otherwise negative ROS  Physical Exam: BP 121/71   Pulse 83  Temp 98.4 F (36.9 C) (Temporal)   Ht 5\' 10"  (1.778 m)   Wt 94.7 kg   SpO2 95%   BMI 29.95 kg/m  General:   Alert,  pleasant and cooperative in NAD Head:  Normocephalic and atraumatic. Neck:  Supple; no masses or thyromegaly. Lungs:  Clear throughout to auscultation.    Heart:  Regular rate and rhythm. Abdomen:  Soft, nontender and nondistended. Normal bowel sounds, without guarding, and without rebound.   Neurologic:  Alert and  oriented x4;  grossly normal neurologically.  Impression/Plan: Matthew Mercado is here for an endoscopy to be performed for cirrhosis and evaluate for varices.  Risks, benefits, limitations, and alternatives regarding  endoscopy have been reviewed with the patient.  Questions have been answered.  All parties agreeable.   Matthew Lame, MD  05/26/2021, 7:32  AM

## 2021-05-26 NOTE — Progress Notes (Signed)
Tried calling pt, no answer please call him to let him know his RMSF titres are positive and he will need rx with doxy x 21 days thnx. Pl let him know to wear protecitve clothing in the sun as he can have photosensitivity to this med.  Needs a fu appt with me x 2 weeks on knee swelling/ new diagnosis of  RMSF

## 2021-05-27 ENCOUNTER — Encounter: Payer: Self-pay | Admitting: Gastroenterology

## 2021-05-29 ENCOUNTER — Other Ambulatory Visit (HOSPITAL_COMMUNITY): Payer: Self-pay

## 2021-05-31 ENCOUNTER — Other Ambulatory Visit (HOSPITAL_COMMUNITY): Payer: Self-pay

## 2021-06-02 ENCOUNTER — Ambulatory Visit: Payer: 59 | Admitting: Internal Medicine

## 2021-06-11 ENCOUNTER — Encounter: Payer: Self-pay | Admitting: Gastroenterology

## 2021-06-11 ENCOUNTER — Ambulatory Visit (INDEPENDENT_AMBULATORY_CARE_PROVIDER_SITE_OTHER): Payer: 59 | Admitting: Gastroenterology

## 2021-06-11 ENCOUNTER — Other Ambulatory Visit: Payer: Self-pay

## 2021-06-11 VITALS — BP 134/74 | HR 102 | Temp 97.3°F | Ht 70.0 in | Wt 217.6 lb

## 2021-06-11 DIAGNOSIS — K7460 Unspecified cirrhosis of liver: Secondary | ICD-10-CM

## 2021-06-11 MED ORDER — CARVEDILOL 3.125 MG PO TABS: ORAL_TABLET | ORAL | 3 refills | Status: DC

## 2021-06-11 NOTE — Progress Notes (Signed)
Primary Care Physician: Matthew Lame, MD  Primary Gastroenterologist:  Dr. Lucilla Mercado  Chief Complaint  Patient presents with   Follow up procedure results     HPI: Matthew Mercado is a 60 y.o. male here for follow-up after his EGD that showed him to have gastric varices. The patient has a history of hepatitis C and alcohol abuse.  At the time of the EGD he had reported that he had completely stopped any alcohol use. The patient reports that he has had some leg swelling.  He denies any black stools or bloody stools.  His ankles or swelling more than any other part of his legs.  The patient came in to discuss the findings of the upper endoscopy and the findings of gastric varices.  Past Medical History:  Diagnosis Date   Cervical spine degeneration    Cirrhosis of liver without ascites ([place]) 04/15/2021   Depression    ETOH abuse 04/02/2021   Hepatitis C     Current Outpatient Medications  Medication Sig Dispense Refill   cetirizine (ZYRTEC) 10 MG tablet Take 10 mg by mouth daily.     doxycycline (VIBRA-TABS) 100 MG tablet Take 1 tablet (100 mg total) by mouth 2 (two) times daily for 21 days. 42 tablet 0   EPCLUSA 400-100 MG TABS Take 1 tablet by mouth daily. 28 tablet 2   Multiple Vitamins-Minerals (MENS MULTIVITAMIN PO) Take by mouth.     RIBOFLAVIN PO Take by mouth.     benzocaine (ORAJEL) 10 % mucosal gel Use as directed 1 application in the mouth or throat as needed for mouth pain. (Patient not taking: Reported on 05/19/2021) 5.3 g 0   No current facility-administered medications for this visit.    Allergies as of 06/11/2021   (No Known Allergies)    ROS:  General: Negative for anorexia, weight loss, fever, chills, fatigue, weakness. ENT: Negative for hoarseness, difficulty swallowing , nasal congestion. CV: Negative for chest pain, angina, palpitations, dyspnea on exertion, peripheral edema.  Respiratory: Negative for dyspnea at rest, dyspnea on exertion,  cough, sputum, wheezing.  GI: See history of present illness. GU:  Negative for dysuria, hematuria, urinary incontinence, urinary frequency, nocturnal urination.  Endo: Negative for unusual weight change.    Physical Examination:   BP 134/74 (BP Location: Left Arm, Patient Position: Sitting, Cuff Size: Large)    Pulse (!) 102    Temp (!) 97.3 F (36.3 C) (Temporal)    Ht 5\' 10"  (1.778 m)    Wt 217 lb 9.6 oz (98.7 kg)    BMI 31.22 kg/m   General: Well-nourished, well-developed in no acute distress.  Eyes: No icterus. Conjunctivae pink. Neuro: Alert and oriented x 3.  Grossly intact. Skin: Warm and dry, no jaundice.   Psych: Alert and cooperative, normal mood and affect.  Labs:    Imaging Studies: DG Knee Complete 4 Views Right  Result Date: 05/20/2021 CLINICAL DATA:  Right knee pain. EXAM: RIGHT KNEE - COMPLETE 4+ VIEW COMPARISON:  None. FINDINGS: There is no acute fracture or dislocation. No significant arthritic changes. The bones are well mineralized. No joint effusion. The soft tissues are unremarkable. IMPRESSION: Negative. Electronically Signed   By: Anner Crete M.D.   On: 05/20/2021 03:08    Assessment and Plan:   Matthew Mercado is a 60 y.o. y/o male who comes in today with a history of abnormal cirrhosis with isolated gastric varices and the need to start a beta blocker. The patient  will be started on Carvedilol with a starting dose of 3.125 mg twice daily. After four days of carvedilol therapy, the dose may be gradually increased to a maximum dose of 6.25 mg twice daily while maintaining a MAP ?82 mmHg and a target resting heart rate ranging from 55 to 60 beats per minute.  The patient's wife is a Marine scientist and we'll monitor this with taking his blood pressure at home and informing me if he is having any side effects.  The patient has been told that once he gets his beta blocker on board and finishes with his hepatitis C treatment then we can try and mobilize some of the fluid  out of his lower extremities.  The patient has been explained the plan and agrees with it.   Matthew Lame, MD. Marval Regal    Note: This dictation was prepared with Dragon dictation along with smaller phrase technology. Any transcriptional errors that result from this process are unintentional.

## 2021-06-12 ENCOUNTER — Ambulatory Visit: Payer: 59 | Admitting: Licensed Clinical Social Worker

## 2021-06-12 NOTE — Chronic Care Management (AMB) (Signed)
° °   Clinical Social Work  Care Management   Phone Outreach    06/12/2021 Name: Matthew Mercado MRN: [id number] DOB: 05/09/1961  Matthew Mercado is a 60 y.o. year old male who is a primary care patient of [name], MD .   Reason for referral: [place] and Resources.    F/U phone call today to assess needs, progress and barriers with care plan goals.   Patient unable to keep phone appointment today and requested to reschedule.  Plan:Appointment was rescheduled with CCM LCSW on 06/13/21  Review of patient status, including review of consultants reports, relevant laboratory and other test results, and collaboration with appropriate care team members and the patient's provider was performed as part of comprehensive patient evaluation and provision of care management services.    Christa See, MSW, [email or web address] Phone [phone] 10:54 AM

## 2021-06-13 ENCOUNTER — Ambulatory Visit: Payer: 59 | Admitting: Licensed Clinical Social Worker

## 2021-06-13 NOTE — Patient Instructions (Signed)
Visit Information  Thank you for taking time to visit with me today. Please don't hesitate to contact me if I can be of assistance to you before our next scheduled telephone appointment.  Following are the goals we discussed today:  Patient Goals/Self-Care Activities: Over the next 120 days Increase coping skills, healthy habits, and self-management skills Attend scheduled appointments with providers Contact PCP office with any questions or concerns  Our next appointment is by telephone on 10/06/21 at 10:00 AM  Please call the care guide team at [phone] if you need to cancel or reschedule your appointment.   If you are experiencing a Mental Health or [place] or need someone to talk to, please call the Suicide and Crisis Lifeline: 988 call 911   Patient verbalizes understanding of instructions provided today and agrees to view in [place].   Christa See, MSW, [email or web address] Phone [phone] 11:18 AM

## 2021-06-13 NOTE — Chronic Care Management (AMB) (Signed)
Care Management Clinical Social Work Note  06/13/2021 Name: Matthew Mercado MRN: [id number] DOB: Nov 08, 1960  Matthew Mercado is a 60 y.o. year old male who is a primary care patient of Matthew Lame, MD.  The Care Management team was consulted for assistance with chronic disease management and coordination needs.  Engaged with patient by telephone for follow up visit in response to provider referral for social work chronic care management and care coordination services  Consent to Services:  Matthew Mercado was given information about Care Management services today including:  Care Management services includes personalized support from designated clinical staff supervised by his physician, including individualized plan of care and coordination with other care providers 24/7 contact phone numbers for assistance for urgent and routine care needs. The patient may stop case management services at any time by phone call to the office staff.  Patient agreed to services and consent obtained.   Consent to Services:  The patient was given information about Care Management services, agreed to services, and gave verbal consent prior to initiation of services.  Please see initial visit note for detailed documentation.   Patient agreed to services today and consent obtained.  Engaged with patient by phone in response to provider referral for social work care coordination services:  Assessment/Interventions:  Patient continues to maintain positive progress with care plan goals. He has maintained sobriety for 9 weeks with support from family and church. Patient has almost completed antibiotics and reports feeling much better due to decrease in joint pain. See Care Plan below for interventions and patient self-care activities.  Recent life changes or stressors: Management of health conditions  Recommendation: Patient may benefit from, and is in agreement work with LCSW to address care coordination needs  and will continue to work with the clinical team to address health care and disease management related needs.  Follow up Plan: Patient would like continued follow-up from CCM LCSW.  per patient's request will follow up in 10/06/21.  Will call office if needed prior to next encounter.  SDOH (Social Determinants of Health) assessments and interventions performed:    Advanced Directives Status: Not addressed in this encounter.  Care Plan  No Known Allergies  Outpatient Encounter Medications as of 06/13/2021  Medication Sig   benzocaine (ORAJEL) 10 % mucosal gel Use as directed 1 application in the mouth or throat as needed for mouth pain. (Patient not taking: Reported on 05/19/2021)   carvedilol (COREG) 3.125 MG tablet Take 2 tablets twice daily   cetirizine (ZYRTEC) 10 MG tablet Take 10 mg by mouth daily.   doxycycline (VIBRA-TABS) 100 MG tablet Take 1 tablet (100 mg total) by mouth 2 (two) times daily for 21 days.   EPCLUSA 400-100 MG TABS Take 1 tablet by mouth daily.   Multiple Vitamins-Minerals (MENS MULTIVITAMIN PO) Take by mouth.   RIBOFLAVIN PO Take by mouth.   No facility-administered encounter medications on file as of 06/13/2021.    Patient Active Problem List   Diagnosis Date Noted   Gastric varices    Cirrhosis of liver without ascites ([place]) 04/15/2021   Annual physical exam 04/02/2021   Elevated LFTs 04/02/2021   Itching 04/02/2021   Hyperbilirubinemia 04/02/2021   ETOH abuse 04/02/2021   Influenza A 08/01/2018   Lateral epicondylitis, right elbow 05/04/2018   Encounter for screening colonoscopy    Benign neoplasm of descending colon    Polyp of sigmoid colon    Elevated glucose 10/20/2017   Leg pain, anterior, left 10/20/2017  Conditions to be addressed/monitored:  Alcohol Use  Care Plan : LCSW Plan of Care  Updates made by Rebekah Chesterfield, LCSW since 06/13/2021 12:00 AM     Problem: Coping Skills (General Plan of Care)      Goal: Coping Skills  Enhanced   Start Date: 04/21/2021  This Visit's Progress: On track  Recent Progress: On track  Priority: High  Note:   Current barriers:   Alcohol Use Disorder Substance abuse issues -  Alcohol Needs Support, Education, and Care Coordination in order to meet unmet mental health needs. Clinical Goal(s): verbalize understanding of plan for management of Alcohol Use Disorder   Clinical Interventions:  Assessed patient's previous and current treatment, coping skills, support system and barriers to care Patient reports hx of ongoing alcohol use. He has maintained sobriety of alcohol for one week, stating he has quit a number of times in the past. Patient denies depression and anxiety symptoms 12/16: Patient has been sober from alcohol for 9 weeks. States he has placed things in perspective and identified strong support system and prayer in keeping him motivated to continue sobriety  Patient's motivation to maintain sobriety includes managing chronic health condition and wanting to be available for grandchildren. Patient receives strong support from spouse, who has agreed to remain sober from alcohol, as well Patient is looking forward to completing Hep C treatment, which starts next week 12/16: Patient reports tx for Hep C is going well. He is on the second phase of tx and has no barriers affording medications. Patient reports he has two more days of antibiotics. States, "I feel better" due to decrease in joint pain Patient enjoys participating in Sunday school and spending time with friends from church members. He continues to work full time  Depression screen reviewed  Mindfulness or Relaxation training provided Active listening / Reflection utilized  Emotional Support Provided Provided psychoeducation for mental health needs  Participation in support group encouraged  Increase in actives / exercise encouraged  Verbalization of feelings encouraged  ; Review various resources, discussed  options and provided patient information about  Options for supportive resources to assist with maintaining sobriety 1:1 collaboration with primary care provider regarding development and update of comprehensive plan of care as evidenced by provider attestation and co-signature Inter-disciplinary care team collaboration (see longitudinal plan of care) Patient Goals/Self-Care Activities: Over the next 120 days Increase coping skills, healthy habits, and self-management skills Attend scheduled appointments with providers Contact PCP office with any questions or concerns       Christa See, MSW, [email or web address] Phone [phone] 11:16 AM

## 2021-06-16 ENCOUNTER — Other Ambulatory Visit: Payer: Self-pay | Admitting: Internal Medicine

## 2021-06-16 NOTE — Telephone Encounter (Signed)
Copied from [place] [id number]. Topic: Quick Communication - Rx Refill/Question >> Jun 16, 2021  9:30 AM Yvette Rack wrote: Pt requests a Rx for 1 more week of the following: Medication: doxycycline (VIBRA-TABS) 100 MG tablet  Has the patient contacted their pharmacy? No. No refills remaining (Agent: If no, request that the patient contact the pharmacy for the refill. If patient does not wish to contact the pharmacy document the reason why and proceed with request.) (Agent: If yes, when and what did the pharmacy advise?)  Preferred Pharmacy (with phone number or street name): [place], [place]  Phone: [phone] Fax: [phone]  Has the patient been seen for an appointment in the last year OR does the patient have an upcoming appointment?   Agent: Please be advised that RX refills may take up to 3 business days. We ask that you follow-up with your pharmacy.

## 2021-06-17 NOTE — Telephone Encounter (Signed)
Patient called back, left VM to return the call to the office.

## 2021-06-17 NOTE — Telephone Encounter (Signed)
Pt called in to speak with nurse. Nurse gave him a call back with no answer. Please advise.

## 2021-06-17 NOTE — Telephone Encounter (Signed)
Patient called, left VM to return the call to the office to speak to a nurse about rx request.

## 2021-06-17 NOTE — Addendum Note (Signed)
Addended by: Elliot Cousin on: 06/17/2021 03:11 PM   Modules accepted: Orders

## 2021-06-17 NOTE — Telephone Encounter (Signed)
Patient needs appt to speak with provider

## 2021-06-18 ENCOUNTER — Ambulatory Visit: Payer: Self-pay

## 2021-06-18 NOTE — Telephone Encounter (Signed)
°  Chief Complaint: numbness L shoulder Symptoms: numbness and burning sensation Frequency: 2-3 weeks Pertinent Negatives:NA Disposition: [] ED /[] Urgent Care (no appt availability in office) / [x] Appointment(In office/virtual)/ []  [facility]/ [] Home Care/ [] Refused Recommended Disposition  Additional Notes: Appt was already scheduled for 06/19/21 at 1540 with Dr. Wynetta Emery. Pt's wife was going to call pt and make sure he would see Dr. Wynetta Emery since he likes Dr. Neomia Dear. Was going to call back if appt needed to be rescheduled with Dr. Neomia Dear on 06/27/21 at 1040.    Reason for Disposition  Numbness (i.e., loss of sensation) in hand or fingers  Answer Assessment - Initial Assessment Questions 1. ONSET: "When did the pain start?"     2-3 weeks 2. LOCATION: "Where is the pain located?"     L shoulder down to L arm and hand 3. PAIN: "How bad is the pain?" (Scale 1-10; or mild, moderate, severe)   - MILD (1-3): doesn't interfere with normal activities   - MODERATE (4-7): interferes with normal activities (e.g., work or school) or awakens from sleep   - SEVERE (8-10): excruciating pain, unable to do any normal activities, unable to move arm at all due to pain     Over 10 5. CAUSE: "What do you think is causing the shoulder pain?"     [place]y [place] spotted fever 6. OTHER SYMPTOMS: "Do you have any other symptoms?" (e.g., neck pain, swelling, rash, fever, numbness, weakness)     Numbness and burning sensation  Protocols used: Shoulder Pain-A-AH

## 2021-06-18 NOTE — Telephone Encounter (Signed)
Pt states that he should have gotten a 4 week supply according to pharmacist instead of a 3 week supply that Dr. Neomia Dear gave him. Pt states "I'm gonna find a new doctor because I shouldn't have to pay for another appt to get a weeks worth of antibiotics that should have been prescribed the first time" and pt hung up.

## 2021-06-19 ENCOUNTER — Other Ambulatory Visit: Payer: Self-pay

## 2021-06-19 ENCOUNTER — Ambulatory Visit: Payer: 59 | Admitting: Family Medicine

## 2021-06-19 ENCOUNTER — Encounter: Payer: Self-pay | Admitting: Family Medicine

## 2021-06-19 VITALS — BP 137/80 | HR 84 | Temp 98.3°F | Wt 224.8 lb

## 2021-06-19 DIAGNOSIS — M47812 Spondylosis without myelopathy or radiculopathy, cervical region: Secondary | ICD-10-CM | POA: Insufficient documentation

## 2021-06-19 DIAGNOSIS — R200 Anesthesia of skin: Secondary | ICD-10-CM

## 2021-06-19 DIAGNOSIS — R5382 Chronic fatigue, unspecified: Secondary | ICD-10-CM

## 2021-06-19 DIAGNOSIS — H9313 Tinnitus, bilateral: Secondary | ICD-10-CM | POA: Insufficient documentation

## 2021-06-19 DIAGNOSIS — M2550 Pain in unspecified joint: Secondary | ICD-10-CM | POA: Diagnosis not present

## 2021-06-19 DIAGNOSIS — K739 Chronic hepatitis, unspecified: Secondary | ICD-10-CM | POA: Diagnosis not present

## 2021-06-19 DIAGNOSIS — K7460 Unspecified cirrhosis of liver: Secondary | ICD-10-CM

## 2021-06-19 MED ORDER — DICLOFENAC SODIUM 1 % EX GEL: 4.0000 g | Freq: Four times a day (QID) | CUTANEOUS | 3 refills | Status: DC

## 2021-06-19 MED ORDER — FUROSEMIDE 40 MG PO TABS: 40.0000 mg | ORAL_TABLET | Freq: Every day | ORAL | 3 refills | Status: DC

## 2021-06-19 NOTE — Assessment & Plan Note (Addendum)
Has a history of cervical spinal surgery in the past. Possibly nerve irritation. Discussed x-ray and PT. He declined. Will start stretches and use voltaren. Will have him return to see PCP in 1-2 weeks.

## 2021-06-19 NOTE — Assessment & Plan Note (Signed)
In the middle of his hep C treatment. Continue to follow with GI. Call with any concerns.

## 2021-06-19 NOTE — Assessment & Plan Note (Signed)
S/p surgery in the past.

## 2021-06-19 NOTE — Progress Notes (Signed)
BP 137/80    Pulse 84    Temp 98.3 F (36.8 C)    Wt 224 lb 12.8 oz (102 kg)    SpO2 96%    BMI 32.26 kg/m    Subjective:    Patient ID: Matthew Mercado, male    DOB: Jun 16, 1961, 60 y.o.   MRN: [id number]  HPI: Matthew Mercado is a 60 y.o. male  Chief Complaint  Patient presents with   Fatigue    Patient was diagnosed with RMSF last month, has taken all the medication and is still feeling fatigue and joint pain    Numbness    Patient states he is having numbness and a burning feeling in his left arm and hand, patient states is normally at night when he's laying down   FATIGUE Duration:  several weeks Severity: mild  Onset: sudden Context when symptoms started:  hep C treatment and RMSF Symptoms improve with rest: no  Depressive symptoms: no Stress/anxiety: no Insomnia: no  Snoring: no Observed apnea by bed partner: no Daytime hypersomnolence:no Wakes feeling refreshed: no History of sleep study: no Dysnea on exertion:  no Orthopnea/PND: no Chest pain: no Chronic cough: no Lower extremity edema: yes Arthralgias:yes Myalgias: yes Weakness: no Rash: no  NUMBNESS- known cervical djd s/p surgery many years ago Duration: 3 weeks Onset: sudden Location: biceps to fingers Bilateral: no Symmetric: no Decreased sensation: yes  Weakness: yes Pain: yes Quality:  numb and burning  Severity: severe  Frequency: at night  Trauma: no Recent illness: no Diabetes: no Thyroid disease: no  HIV: no  Alcoholism: yes  Spinal cord injury: no  Relevant past medical, surgical, family and social history reviewed and updated as indicated. Interim medical history since our last visit reviewed. Allergies and medications reviewed and updated.  Review of Systems  Constitutional:  Positive for fatigue. Negative for activity change, appetite change, chills, diaphoresis, fever and unexpected weight change.  Respiratory: Negative.    Cardiovascular: Negative.    Musculoskeletal:  Positive for arthralgias. Negative for back pain, gait problem, joint swelling, myalgias, neck pain and neck stiffness.  Neurological:  Positive for numbness. Negative for dizziness, tremors, seizures, syncope, facial asymmetry, speech difficulty, weakness, light-headedness and headaches.  Psychiatric/Behavioral: Negative.     Per HPI unless specifically indicated above     Objective:    BP 137/80    Pulse 84    Temp 98.3 F (36.8 C)    Wt 224 lb 12.8 oz (102 kg)    SpO2 96%    BMI 32.26 kg/m   Wt Readings from Last 3 Encounters:  06/19/21 224 lb 12.8 oz (102 kg)  06/11/21 217 lb 9.6 oz (98.7 kg)  05/26/21 208 lb 11.2 oz (94.7 kg)    Physical Exam Vitals and nursing note reviewed.  Constitutional:      General: He is not in acute distress.    Appearance: Normal appearance. He is not ill-appearing, toxic-appearing or diaphoretic.  HENT:     Head: Normocephalic and atraumatic.     Right Ear: External ear normal.     Left Ear: External ear normal.     Nose: Nose normal.     Mouth/Throat:     Mouth: Mucous membranes are moist.     Pharynx: Oropharynx is clear.  Eyes:     General: No scleral icterus.       Right eye: No discharge.        Left eye: No discharge.  Extraocular Movements: Extraocular movements intact.     Conjunctiva/sclera: Conjunctivae normal.     Pupils: Pupils are equal, round, and reactive to light.  Cardiovascular:     Rate and Rhythm: Normal rate and regular rhythm.     Pulses: Normal pulses.     Heart sounds: Normal heart sounds. No murmur heard.   No friction rub. No gallop.  Pulmonary:     Effort: Pulmonary effort is normal. No respiratory distress.     Breath sounds: Normal breath sounds. No stridor. No wheezing, rhonchi or rales.  Chest:     Chest wall: No tenderness.  Musculoskeletal:        General: Normal range of motion.     Cervical back: Normal range of motion and neck supple.  Skin:    General: Skin is warm and dry.      Capillary Refill: Capillary refill takes less than 2 seconds.     Coloration: Skin is not jaundiced or pale.     Findings: No bruising, erythema, lesion or rash.  Neurological:     General: No focal deficit present.     Mental Status: He is alert and oriented to person, place, and time. Mental status is at baseline.  Psychiatric:        Mood and Affect: Mood normal.        Behavior: Behavior normal.        Thought Content: Thought content normal.        Judgment: Judgment normal.    Results for orders placed or performed in visit on 05/19/21  Rapid Strep screen([facility])   Specimen: Nasopharyngeal   Naso  Result Value Ref Range   Strep Gp A Ag, IA W/Reflex Negative Negative  Culture, Group A Strep   Naso  Result Value Ref Range   Strep A Culture Negative   Veritor Flu A/B Waived  Result Value Ref Range   Influenza A Negative Negative   Influenza B Negative Negative  Rheumatoid factor  Result Value Ref Range   Rhuematoid fact SerPl-aCnc 10.6 <14.0 IU/mL  ANA  Result Value Ref Range   Anti Nuclear Antibody (ANA) Negative Negative  Rocky [place]tn spotted fvr abs pnl(IgG+IgM)  Result Value Ref Range   RMSF IgG Negative Negative   RMSF IgM 0.92 (H) 0.00 - 0.89 index  Uric acid  Result Value Ref Range   Uric Acid 3.8 3.8 - 8.4 mg/dL  Ehrlichia antibody panel  Result Value Ref Range   E.Chaffeensis (HME) IgG Negative Neg:<1:64   E. Chaffeensis (HME) IgM Titer Negative Neg:<1:20   HGE IgG Titer Negative Neg:<1:64   HGE IgM Titer Negative Neg:<1:20  Babesia microti Antibody Panel  Result Value Ref Range   Babesia microti IgM <1:10 Neg:<1:10   Babesia microti IgG <1:10 Neg:<1:10  Comprehensive metabolic panel  Result Value Ref Range   Glucose 80 70 - 99 mg/dL   BUN 11 6 - 24 mg/dL   Creatinine, Ser 0.84 0.76 - 1.27 mg/dL   eGFR 100 >59 mL/min/1.73   BUN/Creatinine Ratio 13 9 - 20   Sodium 143 134 - 144 mmol/L   Potassium 4.2 3.5 - 5.2 mmol/L   Chloride 106 96  - 106 mmol/L   CO2 24 20 - 29 mmol/L   Calcium 9.0 8.7 - 10.2 mg/dL   Total Protein 6.3 6.0 - 8.5 g/dL   Albumin 3.6 (L) 3.8 - 4.9 g/dL   Globulin, Total 2.7 1.5 - 4.5 g/dL   Albumin/Globulin Ratio 1.3 1.2 -  2.2   Bilirubin Total 1.0 0.0 - 1.2 mg/dL   Alkaline Phosphatase 81 44 - 121 IU/L   AST 45 (H) 0 - 40 IU/L   ALT 31 0 - 44 IU/L  CBC with Differential/Platelet  Result Value Ref Range   WBC 6.8 3.4 - 10.8 x10E3/uL   RBC 4.23 4.14 - 5.80 x10E6/uL   Hemoglobin 14.4 13.0 - 17.7 g/dL   Hematocrit 40.5 37.5 - 51.0 %   MCV 96 79 - 97 fL   MCH 34.0 (H) 26.6 - 33.0 pg   MCHC 35.6 31.5 - 35.7 g/dL   RDW 11.9 11.6 - 15.4 %   Platelets 178 150 - 450 x10E3/uL   Neutrophils 48 Not Estab. %   Lymphs 30 Not Estab. %   Monocytes 13 Not Estab. %   Eos 8 Not Estab. %   Basos 1 Not Estab. %   Neutrophils Absolute 3.2 1.4 - 7.0 x10E3/uL   Lymphocytes Absolute 2.0 0.7 - 3.1 x10E3/uL   Monocytes Absolute 0.9 0.1 - 0.9 x10E3/uL   EOS (ABSOLUTE) 0.6 (H) 0.0 - 0.4 x10E3/uL   Basophils Absolute 0.1 0.0 - 0.2 x10E3/uL   Immature Granulocytes 0 Not Estab. %   Immature Grans (Abs) 0.0 0.0 - 0.1 x10E3/uL      Assessment & Plan:   Problem List Items Addressed This Visit       Digestive   Chronic hepatitis ([place])    In the middle of his hep C treatment. Continue to follow with GI. Call with any concerns.         Musculoskeletal and Integument   DJD (degenerative joint disease) of cervical spine    S/p surgery in the past.        Other   Numbness - Primary    Has a history of cervical spinal surgery in the past. Possibly nerve irritation. Discussed x-ray and PT. He declined. Will start stretches and use voltaren. Will have him return to see PCP in 1-2 weeks.       Relevant Orders   VITAMIN D 25 Hydroxy (Vit-D Deficiency, Fractures)   TSH   Basic metabolic panel   H70   Other Visit Diagnoses     Arthralgia, unspecified joint       Likely due to hep C treatment. Will check lyme and  thyroid as not checked last visit. Follow up with PCP in 1-2 weeks.   Relevant Orders   Lyme Disease Serology w/Reflex   Chronic fatigue       Likely due to hep C treatment. Will check lyme and thyroid as not checked last visit. Follow up with PCP in 1-2 weeks.         Follow up plan: Return 1-2 weeks with PCP.

## 2021-06-20 ENCOUNTER — Other Ambulatory Visit (HOSPITAL_COMMUNITY): Payer: Self-pay

## 2021-06-20 LAB — BASIC METABOLIC PANEL
BUN/Creatinine Ratio: 18 (ref 9–20)
BUN: 16 mg/dL (ref 6–24)
CO2: 27 mmol/L (ref 20–29)
Calcium: 9.2 mg/dL (ref 8.7–10.2)
Chloride: 103 mmol/L (ref 96–106)
Creatinine, Ser: 0.89 mg/dL (ref 0.76–1.27)
Glucose: 100 mg/dL — ABNORMAL HIGH (ref 70–99)
Potassium: 4.4 mmol/L (ref 3.5–5.2)
Sodium: 141 mmol/L (ref 134–144)
eGFR: 99 mL/min/{1.73_m2} (ref >59)

## 2021-06-20 LAB — VITAMIN D 25 HYDROXY (VIT D DEFICIENCY, FRACTURES): Vit D, 25-Hydroxy: 34.3 ng/mL (ref 30.0–100.0)

## 2021-06-20 LAB — TSH: TSH: 2 u[IU]/mL (ref 0.450–4.500)

## 2021-06-20 LAB — VITAMIN B12: Vitamin B-12: 971 pg/mL (ref 232–1245)

## 2021-06-20 LAB — LYME DISEASE SEROLOGY W/REFLEX: Lyme Total Antibody EIA: NEGATIVE

## 2021-06-26 ENCOUNTER — Other Ambulatory Visit: Payer: Self-pay

## 2021-06-27 ENCOUNTER — Other Ambulatory Visit (HOSPITAL_COMMUNITY): Payer: Self-pay

## 2021-07-03 ENCOUNTER — Ambulatory Visit: Payer: 59 | Admitting: Internal Medicine

## 2021-07-23 ENCOUNTER — Other Ambulatory Visit (HOSPITAL_COMMUNITY): Payer: Self-pay

## 2021-08-05 ENCOUNTER — Other Ambulatory Visit (HOSPITAL_COMMUNITY): Payer: Self-pay

## 2021-09-09 ENCOUNTER — Ambulatory Visit
Admission: RE | Admit: 2021-09-09 | Discharge: 2021-09-09 | Disposition: A | Discharge status: Home / Self Care | Payer: 59 | Source: Home / Self Care | Attending: Internal Medicine | Admitting: Internal Medicine

## 2021-09-09 ENCOUNTER — Other Ambulatory Visit: Payer: Self-pay

## 2021-09-09 ENCOUNTER — Ambulatory Visit
Admission: RE | Admit: 2021-09-09 | Discharge: 2021-09-09 | Disposition: A | Discharge status: Home / Self Care | Payer: 59 | Source: Ambulatory Visit | Attending: Internal Medicine | Admitting: Internal Medicine

## 2021-09-09 ENCOUNTER — Encounter: Payer: Self-pay | Admitting: Internal Medicine

## 2021-09-09 ENCOUNTER — Ambulatory Visit: Payer: 59 | Admitting: Internal Medicine

## 2021-09-09 VITALS — BP 111/69 | HR 62 | Temp 98.0°F | Ht 70.0 in | Wt 224.8 lb

## 2021-09-09 DIAGNOSIS — M25512 Pain in left shoulder: Secondary | ICD-10-CM

## 2021-09-09 DIAGNOSIS — R739 Hyperglycemia, unspecified: Secondary | ICD-10-CM

## 2021-09-09 DIAGNOSIS — R7989 Other specified abnormal findings of blood chemistry: Secondary | ICD-10-CM | POA: Diagnosis not present

## 2021-09-09 DIAGNOSIS — M19012 Primary osteoarthritis, left shoulder: Secondary | ICD-10-CM | POA: Diagnosis not present

## 2021-09-09 MED ORDER — METHYLPREDNISOLONE 4 MG PO TBPK: ORAL_TABLET | ORAL | 0 refills | Status: DC

## 2021-09-09 NOTE — Progress Notes (Signed)
? ?BP 111/69   Pulse 62   Temp 98 ?F (36.7 ?C) (Oral)   Ht $R'5\' 10"'CV$  (1.778 m)   Wt 224 lb 12.8 oz (102 kg)   SpO2 97%   BMI 32.26 kg/m?   ? ?Subjective:  ? ? Patient ID: Matthew Mercado, male    DOB: January 21, 1961, 61 y.o.   MRN: [id number] ? ?Chief Complaint  ?Patient presents with  ? Shoulder Pain  ?  Left shoulder pain and having numbness in his fingertips and worse at night, for months.   ? ? ?HPI: ?Matthew Mercado is a 61 y.o. male ? ?Pt is here for a follow up history of Hep C s/p rx with antivirals and alcoholic cirrhosis with isolated gastric varices. Sees GI for such  ? ?Shoulder Pain  ?This is a chronic problem. The current episode started 1 to 4 weeks ago. There has been no history of extremity trauma. The problem occurs intermittently. The problem has been gradually worsening. The pain is at a severity of 6/10. The pain is mild. Associated symptoms include a limited range of motion and stiffness. Pertinent negatives include no fever, inability to bear weight, itching, joint locking, joint swelling, numbness or tingling. His past medical history is significant for osteoarthritis.  ?Neurologic Problem ?Primary symptoms comment: tingling and numbness in the bil hands has had cx spine. Pertinent negatives include no fever.  ? ?Chief Complaint  ?Patient presents with  ? Shoulder Pain  ?  Left shoulder pain and having numbness in his fingertips and worse at night, for months.   ? ? ?Relevant past medical, surgical, family and social history reviewed and updated as indicated. Interim medical history since our last visit reviewed. ?Allergies and medications reviewed and updated. ? ?Review of Systems  ?Constitutional:  Negative for fever.  ?Musculoskeletal:  Positive for stiffness.  ?Skin:  Negative for itching.  ?Neurological:  Negative for tingling and numbness.  ? ?Per HPI unless specifically indicated above ? ?   ?Objective:  ?  ?BP 111/69   Pulse 62   Temp 98 ?F (36.7 ?C) (Oral)   Ht $R'5\' 10"'un$  (1.778  m)   Wt 224 lb 12.8 oz (102 kg)   SpO2 97%   BMI 32.26 kg/m?   ?Wt Readings from Last 3 Encounters:  ?09/09/21 224 lb 12.8 oz (102 kg)  ?06/19/21 224 lb 12.8 oz (102 kg)  ?06/11/21 217 lb 9.6 oz (98.7 kg)  ?  ?Physical Exam ?Vitals and nursing note reviewed.  ?Constitutional:   ?   General: He is not in acute distress. ?   Appearance: Normal appearance. He is not ill-appearing or diaphoretic.  ?HENT:  ?   Head: Normocephalic and atraumatic.  ?   Right Ear: Tympanic membrane and external ear normal. There is no impacted cerumen.  ?   Left Ear: External ear normal.  ?   Nose: No congestion or rhinorrhea.  ?   Mouth/Throat:  ?   Pharynx: No oropharyngeal exudate or posterior oropharyngeal erythema.  ?Eyes:  ?   Conjunctiva/sclera: Conjunctivae normal.  ?   Pupils: Pupils are equal, round, and reactive to light.  ?Cardiovascular:  ?   Rate and Rhythm: Normal rate and regular rhythm.  ?   Heart sounds: No murmur heard. ?  No friction rub. No gallop.  ?Pulmonary:  ?   Effort: No respiratory distress.  ?   Breath sounds: No stridor. No wheezing or rhonchi.  ?Chest:  ?   Chest wall: No tenderness.  ?  Abdominal:  ?   General: Abdomen is flat. Bowel sounds are normal.  ?   Palpations: Abdomen is soft. There is no mass.  ?   Tenderness: There is no abdominal tenderness.  ?Musculoskeletal:  ?   Cervical back: Normal range of motion and neck supple. No rigidity or tenderness.  ?   Left lower leg: No edema.  ?Skin: ?   General: Skin is warm and dry.  ?Neurological:  ?   Mental Status: He is alert.  ? ? ?Results for orders placed or performed in visit on 06/19/21  ?Lyme Disease Serology w/Reflex  ?Result Value Ref Range  ? Lyme Total Antibody EIA Negative Negative  ?VITAMIN D 25 Hydroxy (Vit-D Deficiency, Fractures)  ?Result Value Ref Range  ? Vit D, 25-Hydroxy 34.3 30.0 - 100.0 ng/mL  ?TSH  ?Result Value Ref Range  ? TSH 2.000 0.450 - 4.500 uIU/mL  ?Basic metabolic panel  ?Result Value Ref Range  ? Glucose 100 (H) 70 - 99 mg/dL   ? BUN 16 6 - 24 mg/dL  ? Creatinine, Ser 0.89 0.76 - 1.27 mg/dL  ? eGFR 99 >59 mL/min/1.73  ? BUN/Creatinine Ratio 18 9 - 20  ? Sodium 141 134 - 144 mmol/L  ? Potassium 4.4 3.5 - 5.2 mmol/L  ? Chloride 103 96 - 106 mmol/L  ? CO2 27 20 - 29 mmol/L  ? Calcium 9.2 8.7 - 10.2 mg/dL  ?B12  ?Result Value Ref Range  ? Vitamin B-12 971 232 - 1,245 pg/mL  ? ?   ? ? ?Current Outpatient Medications:  ?  carvedilol (COREG) 3.125 MG tablet, Take 2 tablets twice daily, Disp: 120 tablet, Rfl: 3 ?  cetirizine (ZYRTEC) 10 MG tablet, Take 10 mg by mouth daily., Disp: , Rfl:  ?  diclofenac Sodium (VOLTAREN) 1 % GEL, Apply 4 g topically 4 (four) times daily., Disp: 100 g, Rfl: 3 ?  furosemide (LASIX) 40 MG tablet, Take 1 tablet (40 mg total) by mouth daily., Disp: 30 tablet, Rfl: 3 ?  methylPREDNISolone (MEDROL DOSEPAK) 4 MG TBPK tablet, Use as directed, Disp: 1 each, Rfl: 0 ?  Multiple Vitamins-Minerals (MENS MULTIVITAMIN PO), Take by mouth., Disp: , Rfl:  ?  RIBOFLAVIN PO, Take by mouth., Disp: , Rfl:  ?  EPCLUSA 400-100 MG TABS, Take 1 tablet by mouth daily. (Patient not taking: Reported on 09/09/2021), Disp: 28 tablet, Rfl: 2  ? ? ?Assessment & Plan:  ?Hepatitis C treatment s/p injections for 84 day tx for such  ? ?Gastric varices ? Sec to portal varices. ? ?Left shoulder pain : ? Sec to  ?Start pt on medrol dose pak. ?Obtained results of xray  ? ? ?Problem List Items Addressed This Visit   ? ?  ? Other  ? Elevated LFTs  ? Relevant Orders  ? CBC with Differential/Platelet  ? Comprehensive metabolic panel  ? Lipid panel  ? Urinalysis, Routine w reflex microscopic  ? TSH  ? Acute pain of left shoulder - Primary  ? Relevant Orders  ? DG Shoulder Left (Completed)  ? CBC with Differential/Platelet  ? Comprehensive metabolic panel  ? Lipid panel  ? Urinalysis, Routine w reflex microscopic  ? TSH  ? ?Other Visit Diagnoses   ? ? Elevated blood sugar      ? Relevant Orders  ? Bayer DCA Hb A1c Waived (STAT)  ? ?  ?  ? ?Orders Placed This  Encounter  ?Procedures  ? DG Shoulder Left  ? CBC with  Differential/Platelet  ? Comprehensive metabolic panel  ? Lipid panel  ? Urinalysis, Routine w reflex microscopic  ? TSH  ? Bayer DCA Hb A1c Waived (STAT)  ?  ? ?Meds ordered this encounter  ?Medications  ? methylPREDNISolone (MEDROL DOSEPAK) 4 MG TBPK tablet  ?  Sig: Use as directed  ?  Dispense:  1 each  ?  Refill:  0  ?  ? ?Follow up plan: ?Return in about 4 weeks (around 10/07/2021). ? ? ? ?

## 2021-09-09 NOTE — Progress Notes (Signed)
Pl let pt know he has mild Degenerative changes of his shoulder joint. He has no acute fractures. He will need to see ortho if the pain persists despite his medrol dos epak to call us in 4-5 days and can refer him out. Thnx.

## 2021-09-17 ENCOUNTER — Encounter: Payer: Self-pay | Admitting: Gastroenterology

## 2021-09-17 ENCOUNTER — Ambulatory Visit (INDEPENDENT_AMBULATORY_CARE_PROVIDER_SITE_OTHER): Payer: 59 | Admitting: Gastroenterology

## 2021-09-17 ENCOUNTER — Other Ambulatory Visit: Payer: Self-pay

## 2021-09-17 VITALS — BP 126/75 | HR 82 | Temp 98.2°F | Wt 221.0 lb

## 2021-09-17 DIAGNOSIS — B182 Chronic viral hepatitis C: Secondary | ICD-10-CM

## 2021-09-17 MED ORDER — CARVEDILOL 3.125 MG PO TABS: ORAL_TABLET | ORAL | 11 refills | Status: DC

## 2021-09-17 MED ORDER — FUROSEMIDE 40 MG PO TABS: 40.0000 mg | ORAL_TABLET | Freq: Every day | ORAL | 3 refills | Status: DC

## 2021-09-17 NOTE — Progress Notes (Signed)
? ? ?Primary Care Physician: Charlynne Cousins, MD ? ?Primary Gastroenterologist:  Dr. [name] ? ?Chief Complaint  ?Patient presents with  ? Follow-up  ? ? ?HPI: Matthew Mercado is a 61 y.o. male here for follow-up with a history of cirrhosis and gastric varices with cirrhosis because by both hepatitis C and alcohol abuse. The patient Had been having some lower extremity swelling. He has had ringing in the right ear that he says started with treatment and some joint pain. He says e HR is around 60's and his blood pressure has ran about 110/70's. His swelling has gone done.  ? ?Past Medical History:  ?Diagnosis Date  ? Cervical spine degeneration   ? Cirrhosis of liver without ascites ([place]) 04/15/2021  ? Depression   ? ETOH abuse 04/02/2021  ? Hepatitis C   ? ? ?Current Outpatient Medications  ?Medication Sig Dispense Refill  ? carvedilol (COREG) 3.125 MG tablet Take 2 tablets twice daily 120 tablet 3  ? cetirizine (ZYRTEC) 10 MG tablet Take 10 mg by mouth daily.    ? diclofenac Sodium (VOLTAREN) 1 % GEL Apply 4 g topically 4 (four) times daily. 100 g 3  ? EPCLUSA 400-100 MG TABS Take 1 tablet by mouth daily. 28 tablet 2  ? furosemide (LASIX) 40 MG tablet Take 1 tablet (40 mg total) by mouth daily. 30 tablet 3  ? methylPREDNISolone (MEDROL DOSEPAK) 4 MG TBPK tablet Use as directed 1 each 0  ? Multiple Vitamins-Minerals (MENS MULTIVITAMIN PO) Take by mouth.    ? RIBOFLAVIN PO Take by mouth.    ? ?No current facility-administered medications for this visit.  ? ? ?Allergies as of 09/17/2021  ? (No Known Allergies)  ? ? ?ROS: ? ?General: Negative for anorexia, weight loss, fever, chills, fatigue, weakness. ?ENT: Negative for hoarseness, difficulty swallowing , nasal congestion. ?CV: Negative for chest pain, angina, palpitations, dyspnea on exertion, peripheral edema.  ?Respiratory: Negative for dyspnea at rest, dyspnea on exertion, cough, sputum, wheezing.  ?GI: See history of present illness. ?GU:  Negative for  dysuria, hematuria, urinary incontinence, urinary frequency, nocturnal urination.  ?Endo: Negative for unusual weight change.  ?  ?Physical Examination: ? ? BP 126/75   Pulse 82   Temp 98.2 ?F (36.8 ?C) (Oral)   Wt 221 lb (100.2 kg)   BMI 31.71 kg/m?  ? ?General: Well-nourished, well-developed in no acute distress.  ?Eyes: No icterus. Conjunctivae pink. ?Extremities: No lower extremity edema. No clubbing or deformities. ?Neuro: Alert and oriented x 3.  Grossly intact. ?Skin: Warm and dry, no jaundice.   ?Psych: Alert and cooperative, normal mood and affect. ? ?Labs:  ?  ?Imaging Studies: ?DG Shoulder Left ? ?Result Date: 09/09/2021 ?CLINICAL DATA:  Left shoulder pain EXAM: LEFT SHOULDER - 3 VIEW COMPARISON:  None. FINDINGS: There is no evidence of fracture or dislocation. Minimal degenerative changes of the acromioclavicular joint. Soft tissues are unremarkable. IMPRESSION: No acute osseous abnormality. Electronically Signed   By: Yetta Glassman M.D.   On: 09/09/2021 12:03   ? ?Assessment and Plan:  ? ?Matthew Mercado is a 61 y.o. y/o male who comes in today with a history of cirrhosis and gastric varices.  The patient has been doing well and tolerated the hepatitis C treatment except for some joint pain and ringing in his right ear that he also reports may be due to his [facility] spotted fever diagnosis but has not resolved despite initiating his antiviral medication over a month ago.  The patient has been told to discuss the symptoms with his primary care provider and he will have his blood sent off for viral load today and for repeat LFTs.  The patient is due for a final series of his hepatitis vaccines and will also be set up for pneumococcal vaccine due to his history of liver disease.  The patient has been explained the plan and agrees with it. ? ? ? ? ?[name], MD. Marval Regal ? ? ? Note: This dictation was prepared with Dragon dictation along with smaller phrase technology. Any transcriptional  errors that result from this process are unintentional.  ?

## 2021-09-18 LAB — HEPATIC FUNCTION PANEL
ALT: 27 IU/L (ref 0–44)
AST: 35 IU/L (ref 0–40)
Albumin: 4.5 g/dL (ref 3.8–4.9)
Alkaline Phosphatase: 71 IU/L (ref 44–121)
Bilirubin Total: 1 mg/dL (ref 0.0–1.2)
Bilirubin, Direct: 0.33 mg/dL (ref 0.00–0.40)
Total Protein: 6.7 g/dL (ref 6.0–8.5)

## 2021-09-18 LAB — HCV RNA QUANT: Hepatitis C Quantitation: NOT DETECTED IU/mL

## 2021-09-23 ENCOUNTER — Encounter: Payer: Self-pay | Admitting: Internal Medicine

## 2021-09-29 ENCOUNTER — Ambulatory Visit (INDEPENDENT_AMBULATORY_CARE_PROVIDER_SITE_OTHER): Payer: 59 | Admitting: Gastroenterology

## 2021-09-29 DIAGNOSIS — Z23 Encounter for immunization: Secondary | ICD-10-CM | POA: Diagnosis not present

## 2021-09-30 NOTE — Progress Notes (Signed)
Immunization visit ?

## 2021-10-01 ENCOUNTER — Telehealth: Payer: Self-pay

## 2021-10-01 NOTE — Telephone Encounter (Signed)
-----   Message from [name], MD sent at 09/19/2021  2:05 PM EDT ----- ?Please let the patient know that his liver enzymes are back to normal and his hepatitis C virus was negative.  He should have it checked again in 1 year's time. ?

## 2021-10-01 NOTE — Telephone Encounter (Signed)
Tried to call patient number kept riming sent mychart message  ?

## 2021-10-06 ENCOUNTER — Telehealth: Payer: Self-pay | Admitting: *Deleted

## 2021-10-06 ENCOUNTER — Telehealth: Payer: 59

## 2021-10-06 NOTE — Telephone Encounter (Signed)
?  Care Management  ? ?Follow Up Note ? ? ?10/06/2021 ?Name: Matthew Mercado MRN: [id number] DOB: Feb 09, 1961 ? ? ?Referred by: Charlynne Cousins, MD ?Reason for referral : No chief complaint on file. ? ? ?An unsuccessful telephone outreach was attempted today. The patient was referred to the case management team for assistance with care management and care coordination.  ? ?Follow Up Plan: The care management team will reach out to the patient again over the next 10 days.  ?Eduard Clos MSW, LCSW ?Licensed Clinical Social Worker ?CCM Coverage      ?[phone]  ?

## 2021-10-07 ENCOUNTER — Ambulatory Visit: Payer: 59 | Admitting: Internal Medicine

## 2021-10-14 ENCOUNTER — Encounter: Payer: Self-pay | Admitting: Internal Medicine

## 2021-10-14 ENCOUNTER — Ambulatory Visit: Payer: 59 | Admitting: Internal Medicine

## 2021-10-14 VITALS — BP 106/69 | HR 65 | Temp 98.5°F | Ht 70.0 in | Wt 220.4 lb

## 2021-10-14 DIAGNOSIS — K7460 Unspecified cirrhosis of liver: Secondary | ICD-10-CM | POA: Diagnosis not present

## 2021-10-14 NOTE — Progress Notes (Signed)
? ?BP 106/69   Pulse 65   Temp 98.5 ?F (36.9 ?C) (Oral)   Ht '5\' 10"'$  (1.778 m)   Wt 220 lb 6.4 oz (100 kg)   SpO2 98%   BMI 31.62 kg/m?   ? ?Subjective:  ? ? Patient ID: OGDEN HANDLIN, male    DOB: 1961-03-24, 61 y.o.   MRN: [id number] ? ?Chief Complaint  ?Patient presents with  ?? Hepatitis  ?  F/u on labs  ? ? ?HPI: ?DAHLTON HINDE is a 61 y.o. male ? ?HPI ? ?Chief Complaint  ?Patient presents with  ?? Hepatitis  ?  F/u on labs  ? ? ?Relevant past medical, surgical, family and social history reviewed and updated as indicated. Interim medical history since our last visit reviewed. ?Allergies and medications reviewed and updated. ? ?Review of Systems ? ?Per HPI unless specifically indicated above ? ?   ?Objective:  ?  ?BP 106/69   Pulse 65   Temp 98.5 ?F (36.9 ?C) (Oral)   Ht '5\' 10"'$  (1.778 m)   Wt 220 lb 6.4 oz (100 kg)   SpO2 98%   BMI 31.62 kg/m?   ?Wt Readings from Last 3 Encounters:  ?10/14/21 220 lb 6.4 oz (100 kg)  ?09/17/21 221 lb (100.2 kg)  ?09/09/21 224 lb 12.8 oz (102 kg)  ?  ?Physical Exam ?Vitals and nursing note reviewed.  ?Constitutional:   ?   General: He is not in acute distress. ?   Appearance: Normal appearance. He is not ill-appearing or diaphoretic.  ?HENT:  ?   Head: Normocephalic and atraumatic.  ?   Right Ear: Tympanic membrane and external ear normal. There is no impacted cerumen.  ?   Left Ear: External ear normal.  ?   Nose: No congestion or rhinorrhea.  ?   Mouth/Throat:  ?   Pharynx: No oropharyngeal exudate or posterior oropharyngeal erythema.  ?Eyes:  ?   Conjunctiva/sclera: Conjunctivae normal.  ?   Pupils: Pupils are equal, round, and reactive to light.  ?Cardiovascular:  ?   Rate and Rhythm: Normal rate and regular rhythm.  ?   Heart sounds: No murmur heard. ?  No friction rub. No gallop.  ?Pulmonary:  ?   Effort: No respiratory distress.  ?   Breath sounds: No stridor. No wheezing or rhonchi.  ?Chest:  ?   Chest wall: No tenderness.  ?Abdominal:  ?   General:  Abdomen is flat. Bowel sounds are normal. There is no distension.  ?   Palpations: Abdomen is soft. There is no mass.  ?   Tenderness: There is no abdominal tenderness. There is no right CVA tenderness, left CVA tenderness, guarding or rebound.  ?   Hernia: No hernia is present.  ?Musculoskeletal:     ?   General: No swelling, tenderness, deformity or signs of injury.  ?   Cervical back: Normal range of motion and neck supple. No rigidity or tenderness.  ?   Right lower leg: No edema.  ?   Left lower leg: No edema.  ?Skin: ?   General: Skin is warm and dry.  ?   Coloration: Skin is not jaundiced or pale.  ?   Findings: Lesion present. No bruising, erythema or rash.  ?Neurological:  ?   Mental Status: He is alert.  ?   Cranial Nerves: No cranial nerve deficit.  ?   Sensory: No sensory deficit.  ?Psychiatric:     ?   Mood and Affect:  Mood normal.     ?   Behavior: Behavior normal.     ?   Thought Content: Thought content normal.  ? ? ?Results for orders placed or performed in visit on 09/17/21  ?HCV RNA quant  ?Result Value Ref Range  ? Hepatitis C Quantitation HCV Not Detected IU/mL  ? Test Information Comment   ?Hepatic function panel  ?Result Value Ref Range  ? Total Protein 6.7 6.0 - 8.5 g/dL  ? Albumin 4.5 3.8 - 4.9 g/dL  ? Bilirubin Total 1.0 0.0 - 1.2 mg/dL  ? Bilirubin, Direct 0.33 0.00 - 0.40 mg/dL  ? Alkaline Phosphatase 71 44 - 121 IU/L  ? AST 35 0 - 40 IU/L  ? ALT 27 0 - 44 IU/L  ? ?   ? ? ?Current Outpatient Medications:  ??  carvedilol (COREG) 3.125 MG tablet, Take 2 tablets twice daily, Disp: 120 tablet, Rfl: 11 ??  cetirizine (ZYRTEC) 10 MG tablet, Take 10 mg by mouth daily., Disp: , Rfl:  ??  diclofenac Sodium (VOLTAREN) 1 % GEL, Apply 4 g topically 4 (four) times daily., Disp: 100 g, Rfl: 3 ??  furosemide (LASIX) 40 MG tablet, Take 1 tablet (40 mg total) by mouth daily., Disp: 90 tablet, Rfl: 3 ??  Multiple Vitamins-Minerals (MENS MULTIVITAMIN PO), Take by mouth., Disp: , Rfl:  ??  RIBOFLAVIN PO,  Take by mouth., Disp: , Rfl:   ? ? ?Assessment & Plan:  ?Hepatitis C +ve  ?- tx with antivirals x 84 days.  ?Labs wnl now LFT and bilirubin normalzied no hep C detected on recent labs  ? Latest Reference Range & Units 04/17/21 15:38 05/19/21 15:05 09/17/21 08:55  ?AST 0 - 40 IU/L 163 (H) 45 (H) 35  ?ALT 0 - 44 IU/L 103 (H) 31 27  ?Total Protein 6.0 - 8.5 g/dL 6.0 6.3 6.7  ?ALT (SGPT) P5P 0 - 55 IU/L 121 (H)    ?Total Bilirubin 0.0 - 1.2 mg/dL 2.1 (H) 1.0 1.0  ?Bilirubin, Total 0.0 - 1.2 mg/dL 1.5 (H)    ?GGT 0 - 65 IU/L 149 (H)    ?BILIRUBIN, DIRECT 0.00 - 0.40 mg/dL 1.45 (H)  0.33  ?(H): Data is abnormally high ? ?Portal history : Cirrhosis and gastric varices with cirrhosis because by both hepatitis C and alcohol abuse ? ?ETOH abuse:  stopped 6 months.  ? ? ?No orders of the defined types were placed in this encounter. ?  ? ?No orders of the defined types were placed in this encounter. ?  ? ?Follow up plan: ?Return in about 4 months (around 02/13/2022). ? ? ? ?

## 2021-10-27 DIAGNOSIS — H5203 Hypermetropia, bilateral: Secondary | ICD-10-CM | POA: Diagnosis not present

## 2021-10-27 DIAGNOSIS — H11002 Unspecified pterygium of left eye: Secondary | ICD-10-CM | POA: Diagnosis not present

## 2021-10-27 DIAGNOSIS — H25042 Posterior subcapsular polar age-related cataract, left eye: Secondary | ICD-10-CM | POA: Diagnosis not present

## 2021-11-19 ENCOUNTER — Telehealth: Payer: 59

## 2021-11-19 ENCOUNTER — Telehealth: Payer: Self-pay | Admitting: Licensed Clinical Social Worker

## 2021-11-19 NOTE — Telephone Encounter (Signed)
    Clinical Social Work  Care Management   Phone Outreach    11/19/2021 Name: Matthew Mercado MRN: [id number] DOB: 1960-07-21  Matthew Mercado is a 61 y.o. year old male who is a primary care patient of Vigg, Avanti, MD .   Reason for referral: [employer] .    F/U phone call today to assess needs, progress and barriers with care plan goals.   Telephone outreach was unsuccessful. A HIPPA compliant phone message was left for the patient providing contact information and requesting a return call.   Plan:CCM LCSW will wait for return call. If no return call is received, Will route chart to Care Guide to see if patient would like to reschedule phone appointment   Review of patient status, including review of consultants reports, relevant laboratory and other test results, and collaboration with appropriate care team members and the patient's provider was performed as part of comprehensive patient evaluation and provision of care management services.    Christa See, MSW, [email or web address] Phone [phone] 3:59 PM

## 2021-12-03 DIAGNOSIS — H01002 Unspecified blepharitis right lower eyelid: Secondary | ICD-10-CM | POA: Diagnosis not present

## 2021-12-03 DIAGNOSIS — H01005 Unspecified blepharitis left lower eyelid: Secondary | ICD-10-CM | POA: Diagnosis not present

## 2021-12-03 DIAGNOSIS — H11042 Peripheral pterygium, stationary, left eye: Secondary | ICD-10-CM | POA: Diagnosis not present

## 2021-12-03 DIAGNOSIS — H25813 Combined forms of age-related cataract, bilateral: Secondary | ICD-10-CM | POA: Diagnosis not present

## 2021-12-22 ENCOUNTER — Telehealth: Payer: 59

## 2021-12-25 ENCOUNTER — Telehealth: Payer: Self-pay | Admitting: Licensed Clinical Social Worker

## 2021-12-25 DIAGNOSIS — H269 Unspecified cataract: Secondary | ICD-10-CM | POA: Diagnosis not present

## 2021-12-25 DIAGNOSIS — H25812 Combined forms of age-related cataract, left eye: Secondary | ICD-10-CM | POA: Diagnosis not present

## 2021-12-25 NOTE — Telephone Encounter (Signed)
    Clinical Social Work  Care Management   Phone Outreach    12/25/2021 Name: AMIRI RIECHERS MRN: [id number] DOB: 06/08/1961  [name] is a 61 y.o. year old male who is a primary care patient of Vigg, Avanti, MD .   Reason for referral: [place] [employer].    2nd unsuccessful telephone outreach attempt.  If unable to reach patient by phone on the 3rd attempt, will discontinue outreach calls but will be available at any time to provide services.   Plan:CCM LCSW will wait for return call. If no return call is received, Will route chart to Care Guide to see if patient would like to reschedule phone appointment   Review of patient status, including review of consultants reports, relevant laboratory and other test results, and collaboration with appropriate care team members and the patient's provider was performed as part of comprehensive patient evaluation and provision of care management services.    Christa See, MSW, [email or web address] Phone [phone] 10:06 AM

## 2021-12-26 ENCOUNTER — Telehealth: Payer: Self-pay

## 2021-12-26 NOTE — Chronic Care Management (AMB) (Signed)
  Care Coordination Note  12/26/2021 Name: Matthew Mercado MRN: [id number] DOB: 1960-11-30  Matthew Mercado is a 61 y.o. year old male who is a primary care patient of Vigg, Avanti, MD and is actively engaged with the care management team. I reached out to Matthew Mercado by phone today to assist with re-scheduling a follow up visit with the Licensed Clinical Social Worker  Follow up plan: Unsuccessful telephone outreach attempt made. A HIPAA compliant phone message was left for the patient providing contact information and requesting a return call.  The care management team will reach out to the patient again over the next 7 days.  If patient returns call to provider office, please advise to call [facility]  at [place], [place], [place], [place] [address] Direct Dial: [phone] [email or web address]

## 2022-01-05 DIAGNOSIS — H25812 Combined forms of age-related cataract, left eye: Secondary | ICD-10-CM | POA: Diagnosis not present

## 2022-01-14 NOTE — Chronic Care Management (AMB) (Signed)
  Care Coordination Note  01/14/2022 Name: [name] MRN: [id number] DOB: September 09, 1960  Matthew Mercado is a 61 y.o. year old male who is a primary care patient of Vigg, Avanti, MD and is actively engaged with the care management team. I reached out to Matthew Mercado by phone today to assist with re-scheduling a follow up visit with the Licensed Clinical Social Worker  Follow up plan: Unable to make contact on outreach attempts x 2. PCP Vigg, Avanti, MD notified via routed documentation in medical record.   Noreene Larsson, [place], [place] [address] Direct Dial: [phone] [email or web address]

## 2022-02-09 ENCOUNTER — Encounter: Payer: Self-pay | Admitting: Internal Medicine

## 2022-02-13 ENCOUNTER — Encounter: Payer: Self-pay | Admitting: Internal Medicine

## 2022-02-23 ENCOUNTER — Ambulatory Visit: Payer: 59 | Admitting: Internal Medicine

## 2022-02-23 ENCOUNTER — Ambulatory Visit: Payer: 59 | Admitting: Physician Assistant

## 2022-04-02 ENCOUNTER — Encounter: Payer: 59 | Admitting: Physician Assistant

## 2022-04-02 ENCOUNTER — Encounter: Payer: 59 | Admitting: Internal Medicine

## 2022-04-26 DIAGNOSIS — R7301 Impaired fasting glucose: Secondary | ICD-10-CM | POA: Insufficient documentation

## 2022-04-26 NOTE — Patient Instructions (Signed)
Cirrhosis  Cirrhosis is long-term (chronic) liver injury. The liver is the body's largest internal organ, and it performs many functions. It converts food into energy, removes toxic material from the blood, makes important proteins, and absorbs necessary vitamins from food. In cirrhosis, healthy liver cells are replaced by scar tissue. This prevents blood from flowing through the liver and makes it difficult for the liver to complete its functions. What are the causes? Common causes of this condition are hepatitis C and long-term alcohol abuse. Other causes include: Nonalcoholic fatty liver disease (NAFLD). This happens when fat is deposited in the liver by causes other than alcohol. Hepatitis B infection. Autoimmune hepatitis. In this condition, the body's defense system (immune system) mistakenly attacks the liver cells, causing inflammation. Diseases that cause blockage of ducts inside the liver. Inherited liver diseases, such as hemochromatosis. This is one of the most common inherited liver diseases. In this disease, deposits of iron collect in the liver and other organs. Reactions to certain long-term medicines, such as amiodarone, a heart medicine. Parasitic infections. These include schistosomiasis, which is caused by a flatworm. Long-term contact to certain toxins. These toxins include certain organic solvents, such as toluene and chloroform. What increases the risk? You are more likely to develop this condition if: You have certain types of viral hepatitis. You abuse alcohol, especially if you are male. You are overweight. You use IV drugs and share needles. You have unprotected sex with someone who has viral hepatitis. What are the signs or symptoms? You may not have any signs and symptoms at first. Symptoms may not develop until the damage to your liver starts to get worse. Early symptoms may include: Weakness and tiredness (fatigue). Changes in sleep patterns or having trouble  sleeping. Itchiness. Tenderness in the right-upper part of your abdomen. Weight loss and muscle loss. Nausea. Loss of appetite. Later symptoms may include: Fatigue or weakness that is getting worse. Yellow skin and eyes (jaundice). Buildup of fluid in the abdomen (ascites). You may notice that your clothes are tight around your waist. Weight gain and swelling of the feet and ankles (edema). Trouble breathing. Easy bruising and bleeding. Vomiting blood, or black or bloody stool. Mental confusion. How is this diagnosed? Your health care provider may suspect cirrhosis based on your symptoms and medical history, especially if you have other medical conditions or a history of alcohol abuse. Your health care provider will do a physical exam to feel your liver and to check for signs of cirrhosis. Tests may include: Blood tests to check: For hepatitis B or C. Kidney function. Liver function. Imaging tests such as: MRI or CT scan to look for changes seen in advanced cirrhosis. Ultrasound to see if normal liver tissue is being replaced by scar tissue. A procedure in which a long needle is used to take a sample of liver tissue to be checked in a lab (biopsy). Liver biopsy can confirm the diagnosis of cirrhosis. How is this treated? Treatment for this condition depends on how damaged your liver is and what caused the damage. It may include treating the symptoms of cirrhosis, or treating the underlying causes to slow the damage. Treatment may include: Making lifestyle changes, such as: Eating a healthy diet. You may need to work with your health care provider or a dietitian to develop an eating plan. Restricting salt intake. Maintaining a healthy weight. Not abusing drugs or alcohol. Taking medicines to: Treat liver infections or other infections. Control itching. Reduce fluid buildup. Reduce certain blood  toxins. Reduce risk of bleeding from enlarged blood vessels in the stomach or esophagus  (varices). Liver transplant. In this procedure, a liver from a donor is used to replace your diseased liver. This is done if cirrhosis has caused liver failure. Other treatments and procedures may be done depending on the problems that you get from cirrhosis. Common problems include liver-related kidney failure (hepatorenal syndrome). Follow these instructions at home:  Take medicines only as told by your health care provider. Do not use medicines that are toxic to your liver. Ask your health care provider before taking any new medicines, including over-the-counter medicines such as NSAIDs. Rest as needed. Eat a well-balanced diet. Limit your salt or water intake, if your health care provider asks you to do this. Do not drink alcohol. This is especially important if you routinely take acetaminophen. Keep all follow-up visits. This is important. Contact a health care provider if you: Have fatigue or weakness that is getting worse. Develop swelling of the hands, feet, or legs, or a buildup of fluid in the abdomen (ascites). Have a fever or chills. Develop loss of appetite. Have nausea or vomiting. Develop jaundice. Develop easy bruising or bleeding. Get help right away if you: Vomit bright red blood or a material that looks like coffee grounds. Have blood in your stools. Notice that your stools appear black and tarry. Become confused. Have chest pain or trouble breathing. These symptoms may represent a serious problem that is an emergency. Do not wait to see if the symptoms will go away. Get medical help right away. Call your local emergency services (911 in the [place] Do not drive yourself to the hospital. Summary Cirrhosis is chronic liver injury. Common causes are hepatitis C and long-term alcohol abuse. Tests used to diagnose cirrhosis include blood tests, imaging tests, and liver biopsy. Treatment for this condition involves treating the underlying cause. Avoid alcohol, drugs, salt,  and medicines that may damage your liver. Get help right away if you vomit bright red blood or a material that looks like coffee grounds. This information is not intended to replace advice given to you by your health care provider. Make sure you discuss any questions you have with your health care provider. Document Revised: 03/28/2020 Document Reviewed: 03/28/2020 Elsevier Patient Education  [place].

## 2022-04-28 ENCOUNTER — Encounter: Payer: Self-pay | Admitting: Nurse Practitioner

## 2022-04-28 ENCOUNTER — Ambulatory Visit: Payer: 59 | Admitting: Nurse Practitioner

## 2022-04-28 VITALS — BP 107/66 | HR 69 | Temp 98.0°F | Ht 70.0 in | Wt 227.0 lb

## 2022-04-28 DIAGNOSIS — R7301 Impaired fasting glucose: Secondary | ICD-10-CM | POA: Diagnosis not present

## 2022-04-28 DIAGNOSIS — K7030 Alcoholic cirrhosis of liver without ascites: Secondary | ICD-10-CM | POA: Diagnosis not present

## 2022-04-28 DIAGNOSIS — R7989 Other specified abnormal findings of blood chemistry: Secondary | ICD-10-CM

## 2022-04-28 DIAGNOSIS — Z114 Encounter for screening for human immunodeficiency virus [HIV]: Secondary | ICD-10-CM

## 2022-04-28 DIAGNOSIS — N400 Enlarged prostate without lower urinary tract symptoms: Secondary | ICD-10-CM | POA: Diagnosis not present

## 2022-04-28 DIAGNOSIS — Z23 Encounter for immunization: Secondary | ICD-10-CM | POA: Diagnosis not present

## 2022-04-28 DIAGNOSIS — K739 Chronic hepatitis, unspecified: Secondary | ICD-10-CM | POA: Diagnosis not present

## 2022-04-28 DIAGNOSIS — F1010 Alcohol abuse, uncomplicated: Secondary | ICD-10-CM

## 2022-04-28 NOTE — Assessment & Plan Note (Signed)
Chronic, ongoing.  Continue collaboration with GI.  Recent notes reviewed.  Recheck CBC, CMP today and check TSH + lipid level.

## 2022-04-28 NOTE — Assessment & Plan Note (Signed)
Noted on past labs, check A1c today and adjust regimen as needed.  Focus on diet changes and regular exercise.

## 2022-04-28 NOTE — Assessment & Plan Note (Signed)
Chronic, ongoing.  Followed by GI.  Will continue this collaboration.  Recent notes and labs reviewed.  Recheck CBC, CMP today and check TSH + lipid level.

## 2022-04-28 NOTE — Assessment & Plan Note (Signed)
On the 22nd of October 2023 he celebrated on year of being alcohol free -- praised for this effort and success.

## 2022-04-28 NOTE — Assessment & Plan Note (Signed)
With underlying Hep C and Cirrhosis, continue collaboration with GI.  Labs today.

## 2022-04-28 NOTE — Progress Notes (Signed)
BP 107/66   Pulse 69   Temp 98 F (36.7 C) (Oral)   Ht '5\' 10"'$  (1.778 m)   Wt 227 lb (103 kg)   SpO2 93%   BMI 32.57 kg/m    Subjective:    Patient ID: Matthew Mercado, male    DOB: 02-24-1961, 61 y.o.   MRN: [id number]  HPI: Matthew Mercado is a 61 y.o. male  Chief Complaint  Patient presents with   Cirrhosis   CIRRHOSIS & CHRONIC HEPATITIS Followed by GI with last visit 09/29/21 with Dr. Allen Norris -- has a visit coming up soon and imaging.  Continues on Lasix daily and Carvedilol.  On the 22nd of this month he celebrated one year of no alcohol.  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 Duration:months Treatments attempted: as above Fever: no Nausea: no Vomiting: no Weight loss: no Decreased appetite: no Diarrhea: no Constipation: no Blood in stool: no Heartburn: no Jaundice: no Rash: no     04/28/2022    2:36 PM 09/09/2021   10:08 AM 06/19/2021    3:54 PM 04/01/2021   10:29 AM 09/26/2020    9:08 AM  Depression screen PHQ 2/9  Decreased Interest 0 0 0 0 0  Down, Depressed, Hopeless 0 0 0 0 0  PHQ - 2 Score 0 0 0 0 0  Altered sleeping 0 1 1 0   Tired, decreased energy 0 1 1 0   Change in appetite 1 1 0 0   Feeling bad or failure about yourself  0 0 0 0   Trouble concentrating 0 0 0 0   Moving slowly or fidgety/restless 0 0 0 0   Suicidal thoughts 0 0 0 0   PHQ-9 Score '1 3 2 '$ 0   Difficult doing work/chores Not difficult at all  Not difficult at all Not difficult at all        04/28/2022    2:36 PM 09/09/2021   10:08 AM 06/19/2021    3:54 PM 04/01/2021   10:30 AM  GAD 7 : Generalized Anxiety Score  Nervous, Anxious, on Edge 0 0 0 0  Control/stop worrying 0 0 1 0  Worry too much - different things 0 1 1 0  Trouble relaxing 0 1 0 0  Restless 0 0 1 0  Easily annoyed or irritable 0 0 0 0  Afraid - awful might happen 0 0 0 0  Total GAD 7 Score 0 2 3 0   Anxiety Difficulty Not difficult at all Not difficult at all Somewhat difficult Not difficult at all      Relevant past medical, surgical, family and social history reviewed and updated as indicated. Interim medical history since our last visit reviewed. Allergies and medications reviewed and updated.  Review of Systems  Constitutional:  Negative for activity change, diaphoresis, fatigue and fever.  Respiratory:  Negative for cough, chest tightness, shortness of breath and wheezing.   Cardiovascular:  Negative for chest pain, palpitations and leg swelling.  Gastrointestinal: Negative.   Endocrine: Negative.   Neurological: Negative.   Psychiatric/Behavioral: Negative.     Per HPI unless specifically indicated above     Objective:    BP 107/66   Pulse 69   Temp 98 F (36.7 C) (Oral)   Ht '5\' 10"'$  (1.778 m)   Wt 227 lb (103 kg)   SpO2 93%  BMI 32.57 kg/m   Wt Readings from Last 3 Encounters:  04/28/22 227 lb (103 kg)  10/14/21 220 lb 6.4 oz (100 kg)  09/17/21 221 lb (100.2 kg)    Physical Exam Vitals and nursing note reviewed.  Constitutional:      General: He is awake. He is not in acute distress.    Appearance: He is well-developed and well-groomed. He is obese. He is not ill-appearing or toxic-appearing.  HENT:     Head: Normocephalic and atraumatic.     Right Ear: Hearing normal. No drainage.     Left Ear: Hearing normal. No drainage.  Eyes:     General: Lids are normal.        Right eye: No discharge.        Left eye: No discharge.     Conjunctiva/sclera: Conjunctivae normal.     Pupils: Pupils are equal, round, and reactive to light.  Neck:     Thyroid: No thyromegaly.     Vascular: No carotid bruit.  Cardiovascular:     Rate and Rhythm: Normal rate and regular rhythm.     Heart sounds: Normal heart sounds, S1 normal and S2 normal. No murmur heard.    No gallop.  Pulmonary:     Effort: Pulmonary effort is normal.     Breath sounds: Normal breath sounds.   Abdominal:     General: Bowel sounds are normal. There is no distension.     Palpations: Abdomen is soft.     Tenderness: There is no abdominal tenderness.  Musculoskeletal:        General: Normal range of motion.     Cervical back: Normal range of motion and neck supple.     Right lower leg: No edema.     Left lower leg: No edema.  Skin:    General: Skin is warm and dry.     Capillary Refill: Capillary refill takes less than 2 seconds.  Neurological:     Mental Status: He is alert and oriented to person, place, and time.     Deep Tendon Reflexes: Reflexes are normal and symmetric.  Psychiatric:        Mood and Affect: Mood normal.        Behavior: Behavior normal. Behavior is cooperative.        Thought Content: Thought content normal.        Judgment: Judgment normal.    Results for orders placed or performed in visit on 09/17/21  HCV RNA quant  Result Value Ref Range   Hepatitis C Quantitation HCV Not Detected IU/mL   Test Information Comment   Hepatic function panel  Result Value Ref Range   Total Protein 6.7 6.0 - 8.5 g/dL   Albumin 4.5 3.8 - 4.9 g/dL   Bilirubin Total 1.0 0.0 - 1.2 mg/dL   Bilirubin, Direct 0.33 0.00 - 0.40 mg/dL   Alkaline Phosphatase 71 44 - 121 IU/L   AST 35 0 - 40 IU/L   ALT 27 0 - 44 IU/L      Assessment & Plan:   Problem List Items Addressed This Visit       Digestive   Chronic hepatitis (HCC)    Chronic, ongoing.  Followed by GI.  Will continue this collaboration.  Recent notes and labs reviewed.  Recheck CBC, CMP today and check TSH + lipid level.      Relevant Orders   CBC with Differential/Platelet   Comprehensive metabolic panel   Cirrhosis of liver  without ascites (HCC) - Primary    Chronic, ongoing.  Continue collaboration with GI.  Recent notes reviewed.  Recheck CBC, CMP today and check TSH + lipid level.      Relevant Orders   CBC with Differential/Platelet   TSH     Endocrine   IFG (impaired fasting glucose)     Noted on past labs, check A1c today and adjust regimen as needed.  Focus on diet changes and regular exercise.      Relevant Orders   Comprehensive metabolic panel   Lipid Panel w/o Chol/HDL Ratio   HgB A1c     Other   Elevated LFTs    With underlying Hep C and Cirrhosis, continue collaboration with GI.  Labs today.      Relevant Orders   Comprehensive metabolic panel   ETOH abuse    On the 22nd of October 2023 he celebrated on year of being alcohol free -- praised for this effort and success.      Relevant Orders   CBC with Differential/Platelet   Comprehensive metabolic panel   Lipid Panel w/o Chol/HDL Ratio   Other Visit Diagnoses     Benign prostatic hyperplasia without lower urinary tract symptoms       PSA on labs today.   Relevant Orders   PSA   Encounter for screening for HIV       HIV screen on labs today per guidelines for one time screening, discussed with patient.   Relevant Orders   HIV Antibody (routine testing w rflx)   Need for Td vaccine       Td vaccine in office today.   Relevant Orders   Td vaccine greater than or equal to 7yo preservative free IM   Flu vaccine need       Flu vaccine in office today.   Relevant Orders   Flu Vaccine QUAD 6+ mos PF IM (Fluarix Quad PF)        Follow up plan: Return in about 6 months (around 10/27/2022) for CIRRHOSIS, HEPATITIS.

## 2022-04-29 LAB — COMPREHENSIVE METABOLIC PANEL
ALT: 18 IU/L (ref 0–44)
AST: 25 IU/L (ref 0–40)
Albumin/Globulin Ratio: 1.9 (ref 1.2–2.2)
Albumin: 4.7 g/dL (ref 3.8–4.9)
Alkaline Phosphatase: 73 IU/L (ref 44–121)
BUN/Creatinine Ratio: 12 (ref 10–24)
BUN: 13 mg/dL (ref 8–27)
Bilirubin Total: 0.5 mg/dL (ref 0.0–1.2)
CO2: 26 mmol/L (ref 20–29)
Calcium: 9.2 mg/dL (ref 8.6–10.2)
Chloride: 101 mmol/L (ref 96–106)
Creatinine, Ser: 1.07 mg/dL (ref 0.76–1.27)
Globulin, Total: 2.5 g/dL (ref 1.5–4.5)
Glucose: 131 mg/dL — ABNORMAL HIGH (ref 70–99)
Potassium: 4 mmol/L (ref 3.5–5.2)
Sodium: 146 mmol/L — ABNORMAL HIGH (ref 134–144)
Total Protein: 7.2 g/dL (ref 6.0–8.5)
eGFR: 79 mL/min/{1.73_m2} (ref >59)

## 2022-04-29 LAB — LIPID PANEL W/O CHOL/HDL RATIO
Cholesterol, Total: 137 mg/dL (ref 100–199)
HDL: 52 mg/dL (ref >39)
LDL Chol Calc (NIH): 47 mg/dL (ref 0–99)
Triglycerides: 241 mg/dL — ABNORMAL HIGH (ref 0–149)
VLDL Cholesterol Cal: 38 mg/dL (ref 5–40)

## 2022-04-29 LAB — HEMOGLOBIN A1C
Est. average glucose Bld gHb Est-mCnc: 105 mg/dL
Hgb A1c MFr Bld: 5.3 % (ref 4.8–5.6)

## 2022-04-29 LAB — CBC WITH DIFFERENTIAL/PLATELET
Basophils Absolute: 0.1 10*3/uL (ref 0.0–0.2)
Basos: 1 %
EOS (ABSOLUTE): 0.6 10*3/uL — ABNORMAL HIGH (ref 0.0–0.4)
Eos: 9 %
Hematocrit: 43 % (ref 37.5–51.0)
Hemoglobin: 15.3 g/dL (ref 13.0–17.7)
Immature Grans (Abs): 0 10*3/uL (ref 0.0–0.1)
Immature Granulocytes: 0 %
Lymphocytes Absolute: 1.5 10*3/uL (ref 0.7–3.1)
Lymphs: 24 %
MCH: 33.2 pg — ABNORMAL HIGH (ref 26.6–33.0)
MCHC: 35.6 g/dL (ref 31.5–35.7)
MCV: 93 fL (ref 79–97)
Monocytes Absolute: 0.6 10*3/uL (ref 0.1–0.9)
Monocytes: 9 %
Neutrophils Absolute: 3.5 10*3/uL (ref 1.4–7.0)
Neutrophils: 57 %
Platelets: 187 10*3/uL (ref 150–450)
RBC: 4.61 x10E6/uL (ref 4.14–5.80)
RDW: 12.6 % (ref 11.6–15.4)
WBC: 6.3 10*3/uL (ref 3.4–10.8)

## 2022-04-29 LAB — TSH: TSH: 2.62 u[IU]/mL (ref 0.450–4.500)

## 2022-04-29 LAB — PSA: Prostate Specific Ag, Serum: 1 ng/mL (ref 0.0–4.0)

## 2022-04-29 LAB — HIV ANTIBODY (ROUTINE TESTING W REFLEX): HIV Screen 4th Generation wRfx: NONREACTIVE

## 2022-04-29 NOTE — Progress Notes (Signed)
Contacted via [place] [facility] [place], your labs have returned: - CBC remains stable with no anemia or infection. - Kidney function, creatinine and eGFR, remains normal, as is liver function, AST and ALT. Salt level, sodium, is a little elevated.  I recommend increase water intake a little daily and decrease salt intake -- we will recheck next visit. - Cholesterol levels show LDL at goal, triglycerides are a little elevated.  Recommend focus on diet -- less processed foods and red meat = more fish, chicken, and healthy food options. - Remainder of labs are all stable = including A1c showing no prediabetes or diabetes.  Any questions? Keep being amazing!!  Thank you for allowing me to participate in your care.  I appreciate you. Kindest regards, Cristofher Livecchi

## 2022-06-08 ENCOUNTER — Ambulatory Visit: Payer: 59 | Admitting: Nurse Practitioner

## 2022-06-08 ENCOUNTER — Encounter: Payer: Self-pay | Admitting: Nurse Practitioner

## 2022-06-08 VITALS — BP 131/83 | HR 91 | Temp 99.4°F | Wt 222.3 lb

## 2022-06-08 DIAGNOSIS — R051 Acute cough: Secondary | ICD-10-CM | POA: Diagnosis not present

## 2022-06-08 DIAGNOSIS — J069 Acute upper respiratory infection, unspecified: Secondary | ICD-10-CM

## 2022-06-08 DIAGNOSIS — J029 Acute pharyngitis, unspecified: Secondary | ICD-10-CM

## 2022-06-08 MED ORDER — METHYLPREDNISOLONE 4 MG PO TBPK: ORAL_TABLET | ORAL | 0 refills | Status: DC

## 2022-06-08 MED ORDER — GUAIFENESIN-CODEINE 100-10 MG/5ML PO SOLN: 5.0000 mL | Freq: Two times a day (BID) | ORAL | 0 refills | Status: DC | PRN

## 2022-06-08 NOTE — Addendum Note (Signed)
Addended by: Georgina Peer on: 06/08/2022 01:57 PM   Modules accepted: Orders

## 2022-06-08 NOTE — Progress Notes (Signed)
BP 131/83   Pulse 91   Temp 99.4 F (37.4 C) (Oral)   Wt 222 lb 4.8 oz (100.8 kg)   SpO2 97%   BMI 31.90 kg/m    Subjective:    Patient ID: Matthew Mercado, male    DOB: April 02, 1961, 61 y.o.   MRN: [id number]  HPI: Matthew Mercado is a 61 y.o. male  Chief Complaint  Patient presents with   URI    Pt states he has been having a cough, chest congestion, headache, sinus pressure, sore throat, and body aches since Friday evening.    UPPER RESPIRATORY TRACT INFECTION Worst symptom: Symptoms started on Friday. Fever: no Cough: yes Shortness of breath: no Wheezing: yes Chest pain: yes, with cough Chest tightness: yes Chest congestion: yes Nasal congestion: yes Runny nose: yes Post nasal drip: yes Sneezing: yes Sore throat: yes Swollen glands: no Sinus pressure: yes Headache: yes Face pain: no Toothache: no Ear pain: no bilateral Ear pressure: no bilateral Eyes red/itching:no Eye drainage/crusting: no  Vomiting: no Rash: no Fatigue: yes Sick contacts: no Strep contacts: no  Context: stable Recurrent sinusitis: no Relief with OTC cold/cough medications:  some   Treatments attempted: cold/sinus   Relevant past medical, surgical, family and social history reviewed and updated as indicated. Interim medical history since our last visit reviewed. Allergies and medications reviewed and updated.  Review of Systems  Constitutional:  Positive for fatigue. Negative for fever.  HENT:  Positive for congestion, postnasal drip, rhinorrhea, sinus pressure, sinus pain, sneezing and sore throat. Negative for ear pain.   Respiratory:  Positive for cough, chest tightness, shortness of breath and wheezing.   Gastrointestinal:  Negative for vomiting.  Skin:  Negative for rash.  Neurological:  Positive for headaches.    Per HPI unless specifically indicated above     Objective:    BP 131/83   Pulse 91   Temp 99.4 F (37.4 C) (Oral)   Wt 222 lb 4.8 oz (100.8 kg)    SpO2 97%   BMI 31.90 kg/m   Wt Readings from Last 3 Encounters:  06/08/22 222 lb 4.8 oz (100.8 kg)  04/28/22 227 lb (103 kg)  10/14/21 220 lb 6.4 oz (100 kg)    Physical Exam Vitals and nursing note reviewed.  Constitutional:      General: He is not in acute distress.    Appearance: Normal appearance. He is not ill-appearing, toxic-appearing or diaphoretic.  HENT:     Head: Normocephalic.     Right Ear: External ear normal. A middle ear effusion is present.     Left Ear: External ear normal. A middle ear effusion is present.     Nose: Congestion and rhinorrhea present.     Mouth/Throat:     Mouth: Mucous membranes are moist.     Pharynx: Posterior oropharyngeal erythema present. No oropharyngeal exudate.  Eyes:     General:        Right eye: No discharge.        Left eye: No discharge.     Extraocular Movements: Extraocular movements intact.     Conjunctiva/sclera: Conjunctivae normal.     Pupils: Pupils are equal, round, and reactive to light.  Cardiovascular:     Rate and Rhythm: Normal rate and regular rhythm.     Heart sounds: No murmur heard. Pulmonary:     Effort: Pulmonary effort is normal. No respiratory distress.     Breath sounds: Normal breath sounds. No wheezing, rhonchi or  rales.  Abdominal:     General: Abdomen is flat. Bowel sounds are normal.  Musculoskeletal:     Cervical back: Normal range of motion and neck supple.  Skin:    General: Skin is warm and dry.     Capillary Refill: Capillary refill takes less than 2 seconds.  Neurological:     General: No focal deficit present.     Mental Status: He is alert and oriented to person, place, and time.  Psychiatric:        Mood and Affect: Mood normal.        Behavior: Behavior normal.        Thought Content: Thought content normal.        Judgment: Judgment normal.     Results for orders placed or performed in visit on 04/28/22  CBC with Differential/Platelet  Result Value Ref Range   WBC 6.3 3.4 -  10.8 x10E3/uL   RBC 4.61 4.14 - 5.80 x10E6/uL   Hemoglobin 15.3 13.0 - 17.7 g/dL   Hematocrit 43.0 37.5 - 51.0 %   MCV 93 79 - 97 fL   MCH 33.2 (H) 26.6 - 33.0 pg   MCHC 35.6 31.5 - 35.7 g/dL   RDW 12.6 11.6 - 15.4 %   Platelets 187 150 - 450 x10E3/uL   Neutrophils 57 Not Estab. %   Lymphs 24 Not Estab. %   Monocytes 9 Not Estab. %   Eos 9 Not Estab. %   Basos 1 Not Estab. %   Neutrophils Absolute 3.5 1.4 - 7.0 x10E3/uL   Lymphocytes Absolute 1.5 0.7 - 3.1 x10E3/uL   Monocytes Absolute 0.6 0.1 - 0.9 x10E3/uL   EOS (ABSOLUTE) 0.6 (H) 0.0 - 0.4 x10E3/uL   Basophils Absolute 0.1 0.0 - 0.2 x10E3/uL   Immature Granulocytes 0 Not Estab. %   Immature Grans (Abs) 0.0 0.0 - 0.1 x10E3/uL  Comprehensive metabolic panel  Result Value Ref Range   Glucose 131 (H) 70 - 99 mg/dL   BUN 13 8 - 27 mg/dL   Creatinine, Ser 1.07 0.76 - 1.27 mg/dL   eGFR 79 >59 mL/min/1.73   BUN/Creatinine Ratio 12 10 - 24   Sodium 146 (H) 134 - 144 mmol/L   Potassium 4.0 3.5 - 5.2 mmol/L   Chloride 101 96 - 106 mmol/L   CO2 26 20 - 29 mmol/L   Calcium 9.2 8.6 - 10.2 mg/dL   Total Protein 7.2 6.0 - 8.5 g/dL   Albumin 4.7 3.8 - 4.9 g/dL   Globulin, Total 2.5 1.5 - 4.5 g/dL   Albumin/Globulin Ratio 1.9 1.2 - 2.2   Bilirubin Total 0.5 0.0 - 1.2 mg/dL   Alkaline Phosphatase 73 44 - 121 IU/L   AST 25 0 - 40 IU/L   ALT 18 0 - 44 IU/L  Lipid Panel w/o Chol/HDL Ratio  Result Value Ref Range   Cholesterol, Total 137 100 - 199 mg/dL   Triglycerides 241 (H) 0 - 149 mg/dL   HDL 52 >39 mg/dL   VLDL Cholesterol Cal 38 5 - 40 mg/dL   LDL Chol Calc (NIH) 47 0 - 99 mg/dL  PSA  Result Value Ref Range   Prostate Specific Ag, Serum 1.0 0.0 - 4.0 ng/mL  TSH  Result Value Ref Range   TSH 2.620 0.450 - 4.500 uIU/mL  HgB A1c  Result Value Ref Range   Hgb A1c MFr Bld 5.3 4.8 - 5.6 %   Est. average glucose Bld gHb Est-mCnc 105 mg/dL  HIV Antibody (routine testing w rflx)  Result Value Ref Range   HIV Screen 4th Generation  wRfx Non Reactive Non Reactive      Assessment & Plan:   Problem List Items Addressed This Visit   None Visit Diagnoses     Viral upper respiratory tract infection    -  Primary   Likely viral. Flu/COVID/STREP sent out. Continue OTC symptom management. Stay hydrated. Will send prednisone and Guafenasin with Codeine for symptom management        Follow up plan: No follow-ups on file.

## 2022-06-10 LAB — NOVEL CORONAVIRUS, NAA: SARS-CoV-2, NAA: NOT DETECTED

## 2022-06-10 NOTE — Progress Notes (Signed)
Hi Abrar. Your strep test was negative.

## 2022-06-10 NOTE — Progress Notes (Signed)
Please let patient know that his COVID test was negative.

## 2022-06-12 LAB — RAPID STREP SCREEN (MED CTR MEBANE ONLY): Strep Gp A Ag, IA W/Reflex: NEGATIVE

## 2022-06-12 LAB — CULTURE, GROUP A STREP: Strep A Culture: NEGATIVE

## 2022-06-16 ENCOUNTER — Ambulatory Visit: Payer: 59 | Admitting: Physician Assistant

## 2022-06-16 ENCOUNTER — Telehealth: Payer: Self-pay | Admitting: Nurse Practitioner

## 2022-06-16 NOTE — Progress Notes (Deleted)
Established Patient Office Visit  Name: Matthew Mercado   MRN: [id number]    DOB: 05-Mar-1961   Date:06/16/2022  Today's Provider: Talitha Givens, MHS, PA-C Introduced myself to the patient as a PA-C and provided education on APPs in clinical practice.         Subjective  Chief Complaint  No chief complaint on file.   HPI   Patient Active Problem List   Diagnosis Date Noted   IFG (impaired fasting glucose) 04/26/2022   Chronic hepatitis ([place]) 06/19/2021   Tinnitus aurium, bilateral 06/19/2021   DJD (degenerative joint disease) of cervical spine 06/19/2021   Gastric varices    Cirrhosis of liver without ascites ([place]) 04/15/2021   Elevated LFTs 04/02/2021   Hyperbilirubinemia 04/02/2021   ETOH abuse 04/02/2021   Benign neoplasm of descending colon    Polyp of sigmoid colon     Past Surgical History:  Procedure Laterality Date   CHOLECYSTECTOMY     COLONOSCOPY WITH PROPOFOL N/A 12/08/2017   Procedure: COLONOSCOPY WITH PROPOFOL;  Surgeon: [name], MD;  Location: [facility];  Service: Endoscopy;  Laterality: N/A;   ESOPHAGOGASTRODUODENOSCOPY (EGD) WITH PROPOFOL N/A 05/26/2021   Procedure: ESOPHAGOGASTRODUODENOSCOPY (EGD) WITH PROPOFOL;  Surgeon: [name], MD;  Location: [facility];  Service: Endoscopy;  Laterality: N/A;   SPINE SURGERY     discectomy and fusion C 4/5   TONSILLECTOMY      Family History  Problem Relation Age of Onset   Cancer Mother    Cancer Father        prostate   Hypertension Father    Sleep apnea Sister    Hypertension Sister    Drug abuse Sister     Social History   Tobacco Use   Smoking status: Former    Types: Cigarettes    Quit date: 11/12/2010    Years since quitting: 11.6   Smokeless tobacco: Never  Substance Use Topics   Alcohol use: Not Currently     Current Outpatient Medications:    carvedilol (COREG) 3.125 MG tablet, Take 2 tablets twice daily, Disp: 120 tablet, Rfl: 11   cetirizine  (ZYRTEC) 10 MG tablet, Take 10 mg by mouth daily., Disp: , Rfl:    furosemide (LASIX) 40 MG tablet, Take 1 tablet (40 mg total) by mouth daily., Disp: 90 tablet, Rfl: 3   guaiFENesin-codeine 100-10 MG/5ML syrup, Take 5 mLs by mouth 2 (two) times daily as needed for cough., Disp: 118 mL, Rfl: 0   methylPREDNISolone (MEDROL DOSEPAK) 4 MG TBPK tablet, Take as directed, Disp: 21 tablet, Rfl: 0   Multiple Vitamins-Minerals (MENS MULTIVITAMIN PO), Take by mouth., Disp: , Rfl:    RIBOFLAVIN PO, Take by mouth., Disp: , Rfl:   No Known Allergies  I personally reviewed {Reviewed:[id number]} with the patient/caregiver today.   ROS    Objective  There were no vitals filed for this visit.  There is no height or weight on file to calculate BMI.  Physical Exam   Recent Results (from the past 2160 hour(s))  CBC with Differential/Platelet     Status: Abnormal   Collection Time: 04/28/22  1:47 PM  Result Value Ref Range   WBC 6.3 3.4 - 10.8 x10E3/uL   RBC 4.61 4.14 - 5.80 x10E6/uL   Hemoglobin 15.3 13.0 - 17.7 g/dL   Hematocrit 43.0 37.5 - 51.0 %   MCV 93 79 - 97 fL   MCH 33.2 (H) 26.6 - 33.0  pg   MCHC 35.6 31.5 - 35.7 g/dL   RDW 12.6 11.6 - 15.4 %   Platelets 187 150 - 450 x10E3/uL   Neutrophils 57 Not Estab. %   Lymphs 24 Not Estab. %   Monocytes 9 Not Estab. %   Eos 9 Not Estab. %   Basos 1 Not Estab. %   Neutrophils Absolute 3.5 1.4 - 7.0 x10E3/uL   Lymphocytes Absolute 1.5 0.7 - 3.1 x10E3/uL   Monocytes Absolute 0.6 0.1 - 0.9 x10E3/uL   EOS (ABSOLUTE) 0.6 (H) 0.0 - 0.4 x10E3/uL   Basophils Absolute 0.1 0.0 - 0.2 x10E3/uL   Immature Granulocytes 0 Not Estab. %   Immature Grans (Abs) 0.0 0.0 - 0.1 x10E3/uL  Comprehensive metabolic panel     Status: Abnormal   Collection Time: 04/28/22  1:47 PM  Result Value Ref Range   Glucose 131 (H) 70 - 99 mg/dL   BUN 13 8 - 27 mg/dL   Creatinine, Ser 1.07 0.76 - 1.27 mg/dL   eGFR 79 >59 mL/min/1.73   BUN/Creatinine Ratio 12 10 - 24   Sodium  146 (H) 134 - 144 mmol/L   Potassium 4.0 3.5 - 5.2 mmol/L   Chloride 101 96 - 106 mmol/L   CO2 26 20 - 29 mmol/L   Calcium 9.2 8.6 - 10.2 mg/dL   Total Protein 7.2 6.0 - 8.5 g/dL   Albumin 4.7 3.8 - 4.9 g/dL   Globulin, Total 2.5 1.5 - 4.5 g/dL   Albumin/Globulin Ratio 1.9 1.2 - 2.2   Bilirubin Total 0.5 0.0 - 1.2 mg/dL   Alkaline Phosphatase 73 44 - 121 IU/L   AST 25 0 - 40 IU/L   ALT 18 0 - 44 IU/L  Lipid Panel w/o Chol/HDL Ratio     Status: Abnormal   Collection Time: 04/28/22  1:47 PM  Result Value Ref Range   Cholesterol, Total 137 100 - 199 mg/dL   Triglycerides 241 (H) 0 - 149 mg/dL   HDL 52 >39 mg/dL   VLDL Cholesterol Cal 38 5 - 40 mg/dL   LDL Chol Calc (NIH) 47 0 - 99 mg/dL  PSA     Status: None   Collection Time: 04/28/22  1:47 PM  Result Value Ref Range   Prostate Specific Ag, Serum 1.0 0.0 - 4.0 ng/mL    Comment: Roche ECLIA methodology. According to the American Urological Association, Serum PSA should decrease and remain at undetectable levels after radical prostatectomy. The AUA defines biochemical recurrence as an initial PSA value 0.2 ng/mL or greater followed by a subsequent confirmatory PSA value 0.2 ng/mL or greater. Values obtained with different assay methods or kits cannot be used interchangeably. Results cannot be interpreted as absolute evidence of the presence or absence of malignant disease.   TSH     Status: None   Collection Time: 04/28/22  1:47 PM  Result Value Ref Range   TSH 2.620 0.450 - 4.500 uIU/mL  HgB A1c     Status: None   Collection Time: 04/28/22  1:47 PM  Result Value Ref Range   Hgb A1c MFr Bld 5.3 4.8 - 5.6 %    Comment:          Prediabetes: 5.7 - 6.4          Diabetes: >6.4          Glycemic control for adults with diabetes: <7.0    Est. average glucose Bld gHb Est-mCnc 105 mg/dL  HIV Antibody (routine testing w  rflx)     Status: None   Collection Time: 04/28/22  1:47 PM  Result Value Ref Range   HIV Screen 4th  Generation wRfx Non Reactive Non Reactive    Comment: HIV Negative HIV-1/HIV-2 antibodies and HIV-1 p24 antigen were NOT detected. There is no laboratory evidence of HIV infection.   Novel Coronavirus, NAA ([facility])     Status: None   Collection Time: 06/08/22  1:42 PM   Specimen: Nasopharyngeal(NP) swabs in vial transport medium  Result Value Ref Range   SARS-CoV-2, NAA Not Detected Not Detected    Comment: This nucleic acid amplification test was developed and its performance characteristics determined by [employer]. Nucleic acid amplification tests include RT-PCR and TMA. This test has not been FDA cleared or approved. This test has been authorized by FDA under an Emergency Use Authorization (EUA). This test is only authorized for the duration of time the declaration that circumstances exist justifying the authorization of the emergency use of in vitro diagnostic tests for detection of SARS-CoV-2 virus and/or diagnosis of COVID-19 infection under section 564(b)(1) of the Act, 21 U.S.C. 382NKN-3(Z) (1), unless the authorization is terminated or revoked sooner. When diagnostic testing is negative, the possibility of a false negative result should be considered in the context of a patient's recent exposures and the presence of clinical signs and symptoms consistent with COVID-19. An individual without symptoms of COVID-19 and who is not shedding SARS-CoV-2 virus wo uld expect to have a negative (not detected) result in this assay.   Rapid Strep screen([facility])     Status: None   Collection Time: 06/08/22  2:01 PM   Specimen: Other   Other  Result Value Ref Range   Strep Gp A Ag, IA W/Reflex Negative Negative  Culture, Group A Strep     Status: None   Collection Time: 06/08/22  2:01 PM   Other  Result Value Ref Range   Strep A Culture Negative     Comment:                             Reference Range: Negative  Flu A+B NAA     Status: None (Preliminary result)    Collection Time: 06/08/22  2:01 PM  Result Value Ref Range   Influenza A, NAA WILL FOLLOW    Influenza B, NAA WILL FOLLOW     Comment: This test was developed and its performance characteristics determined by [facility]. It has not been cleared or approved by the Food and Drug Administration.   Specimen status report     Status: None   Collection Time: 06/08/22  2:01 PM  Result Value Ref Range   specimen status report Comment     Comment: Written Authorization Written Authorization Written Authorization Received. Authorization received from [place] 06-14-2022 Logged by Lisabeth Pick Poteat      PHQ2/9:    04/28/2022    2:36 PM 09/09/2021   10:08 AM 06/19/2021    3:54 PM 04/01/2021   10:29 AM 09/26/2020    9:08 AM  Depression screen PHQ 2/9  Decreased Interest 0 0 0 0 0  Down, Depressed, Hopeless 0 0 0 0 0  PHQ - 2 Score 0 0 0 0 0  Altered sleeping 0 1 1 0   Tired, decreased energy 0 1 1 0   Change in appetite 1 1 0 0   Feeling bad or failure about yourself  0 0 0 0  Trouble concentrating 0 0 0 0   Moving slowly or fidgety/restless 0 0 0 0   Suicidal thoughts 0 0 0 0   PHQ-9 Score _0 0   Difficult doing work/chores Not difficult at all  Not difficult at all Not difficult at all       Fall Risk:    09/09/2021   10:08 AM 05/19/2021    2:35 PM 04/02/2021    3:55 PM 04/01/2021   10:30 AM 09/26/2020    9:07 AM  [place] in the past year? 0 0 0 0 0  Number falls in past yr: 0 0 0 0   Injury with Fall? 0 0 0 0 0  Risk for fall due to : _1   Follow up Falls evaluation completed Falls evaluation completed Falls evaluation completed Falls evaluation completed       Functional Status Survey:      Assessment & Plan

## 2022-06-16 NOTE — Telephone Encounter (Signed)
Called and spoke with patient. Scheduled follow up appointment for 1:40 on Thursday with Erin.

## 2022-06-16 NOTE — Telephone Encounter (Signed)
Patient was scheduled a follow up appt. with the wrong office and there was not another provider to see  for the rest of the week. He states that he is getting better but thinks that he needs another round of steroids. Please advise

## 2022-06-16 NOTE — Telephone Encounter (Signed)
Viruses can linger for a couple of weeks. If patient needs more medication he will need to be seen.  I recommend he be scheduled with Erin later this week.

## 2022-06-17 LAB — SPECIMEN STATUS REPORT

## 2022-06-17 LAB — FLU A+B NAA
Influenza A, NAA: NOT DETECTED
Influenza B, NAA: NOT DETECTED

## 2022-06-17 NOTE — Progress Notes (Signed)
Hi Matthew Mercado. Your flu test was negative.

## 2022-06-18 ENCOUNTER — Encounter: Payer: Self-pay | Admitting: Physician Assistant

## 2022-06-18 ENCOUNTER — Ambulatory Visit: Payer: 59 | Admitting: Physician Assistant

## 2022-06-18 VITALS — BP 115/71 | HR 72 | Temp 97.9°F | Ht 70.0 in | Wt 217.2 lb

## 2022-06-18 DIAGNOSIS — J9801 Acute bronchospasm: Secondary | ICD-10-CM | POA: Diagnosis not present

## 2022-06-18 MED ORDER — BENZONATATE 200 MG PO CAPS: 200.0000 mg | ORAL_CAPSULE | Freq: Two times a day (BID) | ORAL | 0 refills | Status: DC | PRN

## 2022-06-18 MED ORDER — PREDNISONE 20 MG PO TABS: ORAL_TABLET | ORAL | 0 refills | Status: DC

## 2022-06-18 NOTE — Progress Notes (Signed)
Acute Office Visit   Patient: Matthew Mercado   DOB: 18-Jan-1961   61 y.o. Male  MRN: [id number] Visit Date: 06/18/2022  Today's healthcare provider: Dani Gobble Renard Caperton, PA-C  Introduced myself to the patient as a Journalist, newspaper and provided education on APPs in clinical practice.    Chief Complaint  Patient presents with   Cough    Can't get rid of, worse at night   Subjective    Cough Associated symptoms include rhinorrhea and wheezing. Pertinent negatives include no chills, fever or shortness of breath.   HPI     Cough    Additional comments: Can't get rid of, worse at night      Last edited by Jerelene Redden, CMA on 06/18/2022  1:48 PM.       Reports he is feeling better but has had persistent coughing Reports coughing is worse at night  Reports rhinorrhea but cough is not productive  Reports feeling like he needs to clear throat  Interventions: Finished medrol and prescription cough medications    Patient was seen for viral URI on 06/08/22  Was prescribed Medrol and guaifenesin-codeine for cough management  Flu, COVID and strep were negative      Medications: Outpatient Medications Prior to Visit  Medication Sig   carvedilol (COREG) 3.125 MG tablet Take 2 tablets twice daily   cetirizine (ZYRTEC) 10 MG tablet Take 10 mg by mouth daily.   furosemide (LASIX) 40 MG tablet Take 1 tablet (40 mg total) by mouth daily.   Multiple Vitamins-Minerals (MENS MULTIVITAMIN PO) Take by mouth.   RIBOFLAVIN PO Take by mouth.   [DISCONTINUED] guaiFENesin-codeine 100-10 MG/5ML syrup Take 5 mLs by mouth 2 (two) times daily as needed for cough.   [DISCONTINUED] methylPREDNISolone (MEDROL DOSEPAK) 4 MG TBPK tablet Take as directed   No facility-administered medications prior to visit.    Review of Systems  Constitutional:  Negative for chills, fatigue and fever.  HENT:  Positive for rhinorrhea.   Respiratory:  Positive for cough and wheezing. Negative for shortness of  breath.        Objective    BP 115/71   Pulse 72   Temp 97.9 F (36.6 C) (Oral)   Ht '5\' 10"'$  (1.778 m)   Wt 217 lb 3.2 oz (98.5 kg)   SpO2 96%   BMI 31.16 kg/m    Physical Exam Vitals reviewed.  Constitutional:      General: He is awake.     Appearance: Normal appearance. He is well-developed and well-groomed.  HENT:     Head: Normocephalic and atraumatic.     Right Ear: Hearing, tympanic membrane, ear canal and external ear normal.     Left Ear: Hearing, tympanic membrane, ear canal and external ear normal.     Mouth/Throat:     Lips: Pink.     Mouth: Mucous membranes are moist.     Pharynx: Oropharynx is clear. Uvula midline. No oropharyngeal exudate or posterior oropharyngeal erythema.     Tonsils: No tonsillar exudate.  Cardiovascular:     Rate and Rhythm: Normal rate and regular rhythm.     Pulses: Normal pulses.          Radial pulses are 2+ on the right side and 2+ on the left side.     Heart sounds: Normal heart sounds. No murmur heard.    No friction rub. No gallop.  Pulmonary:     Effort: Pulmonary effort  is normal.     Breath sounds: Normal breath sounds and air entry. No decreased air movement. No decreased breath sounds, wheezing, rhonchi or rales.  Lymphadenopathy:     Head:     Right side of head: No submental, submandibular or preauricular adenopathy.     Left side of head: No submental, submandibular or preauricular adenopathy.     Cervical:     Right cervical: No superficial or posterior cervical adenopathy.    Left cervical: No superficial or posterior cervical adenopathy.     Upper Body:     Right upper body: No supraclavicular adenopathy.     Left upper body: No supraclavicular adenopathy.  Neurological:     General: No focal deficit present.     Mental Status: He is alert and oriented to person, place, and time.  Psychiatric:        Mood and Affect: Mood normal.        Behavior: Behavior normal. Behavior is cooperative.        Thought  Content: Thought content normal.        Judgment: Judgment normal.       No results found for any visits on 06/18/22.  Assessment & Plan      No follow-ups on file.      Problem List Items Addressed This Visit   None Visit Diagnoses     Cough due to bronchospasm    -  Primary Acute, ongoing  Chart review reveals recent viral illness and suspect he has lingering post-viral cough or bronchospasm Will send in script for Prednisone taper and tessalon pearls to assist with coughing PE was reassuring today and he denies other symptoms that point toward infectious etiology- fever, nasal congestion, chills, nausea, vomiting, myalgias Follow up as needed for persistent or progressing symptoms     Relevant Medications   predniSONE (DELTASONE) 20 MG tablet   benzonatate (TESSALON) 200 MG capsule        No follow-ups on file.   I, Endora Teresi E Floy Angert, PA-C, have reviewed all documentation for this visit. The documentation on 06/18/22 for the exam, diagnosis, procedures, and orders are all accurate and complete.   Talitha Givens, MHS, PA-C [facility]

## 2022-06-28 IMAGING — DX DG KNEE COMPLETE 4+V*R*
4 series · 4 of 4 positions shown · non-contrast
Comparison: None.

CLINICAL DATA: Right knee pain.

EXAM:
RIGHT KNEE - COMPLETE 4+ VIEW

[knee ap]
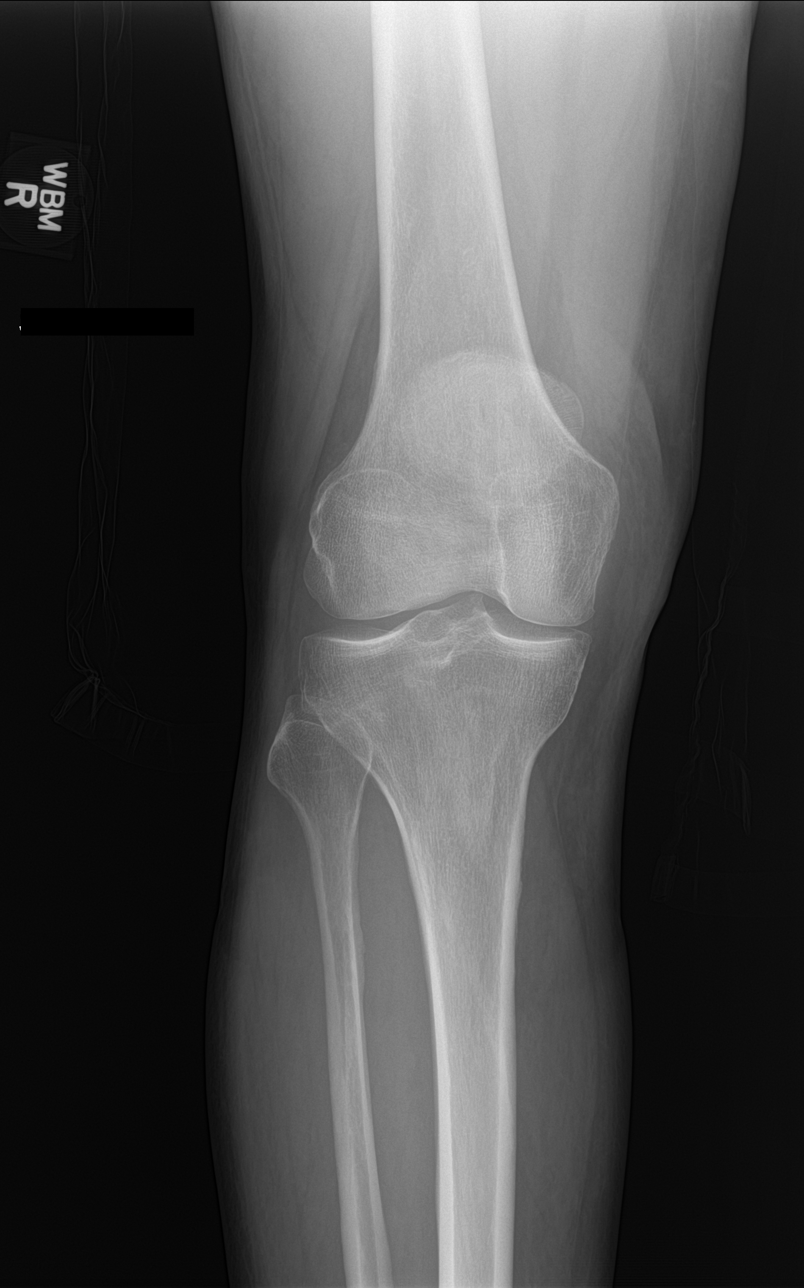

[knee tunnel]
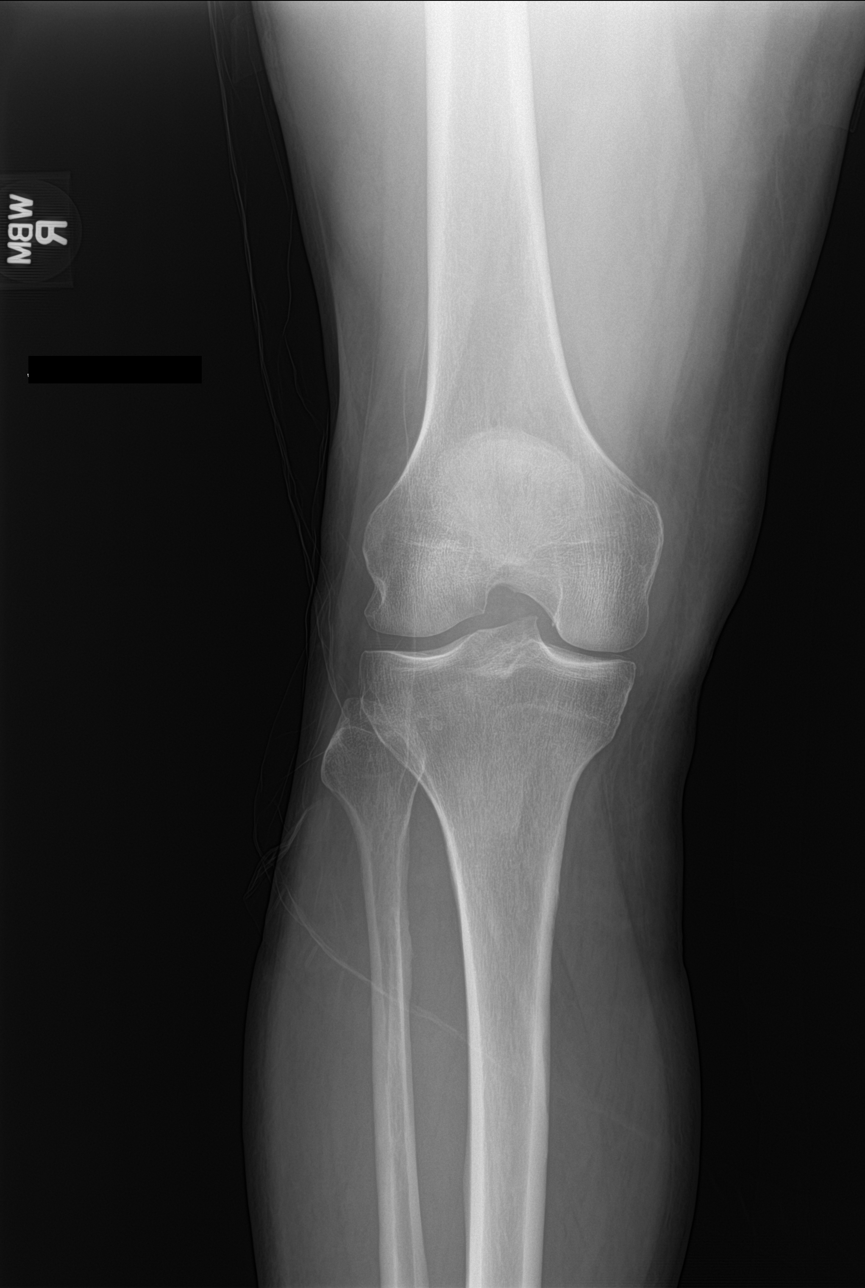

[knee lat]
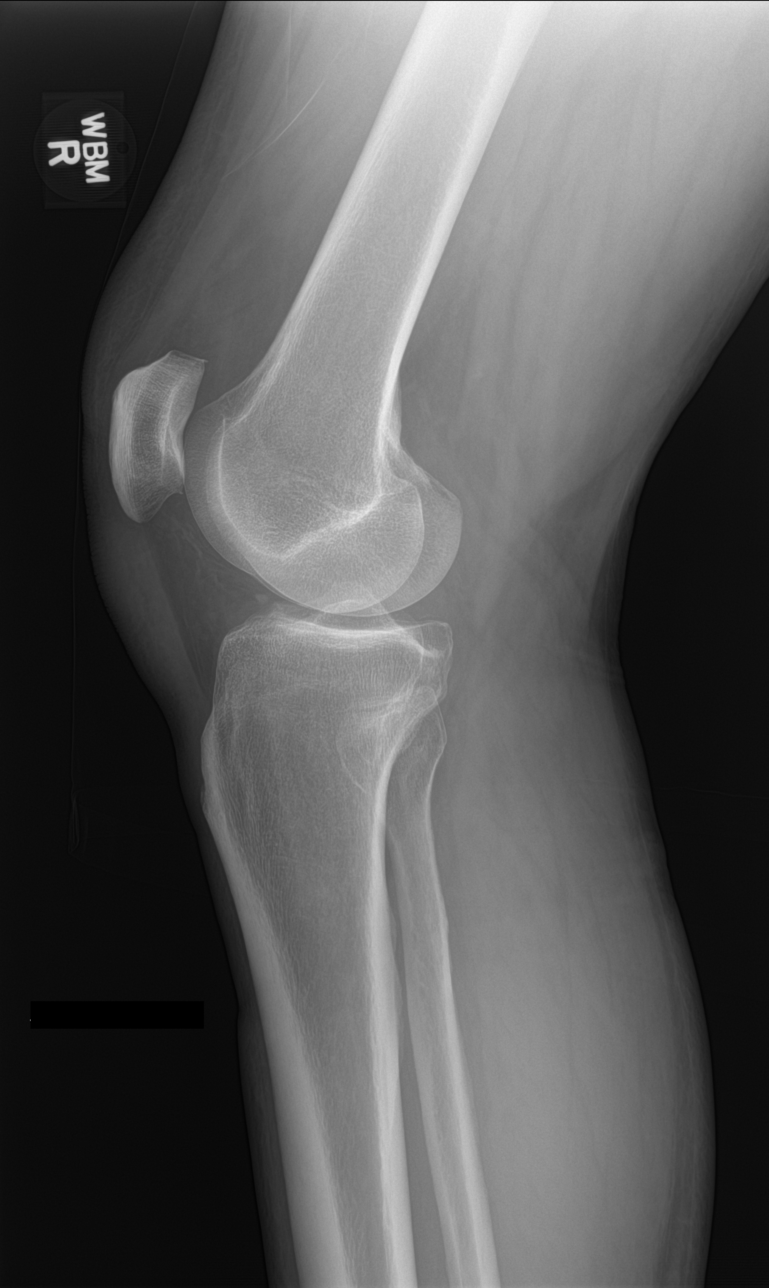

[patella skyline]
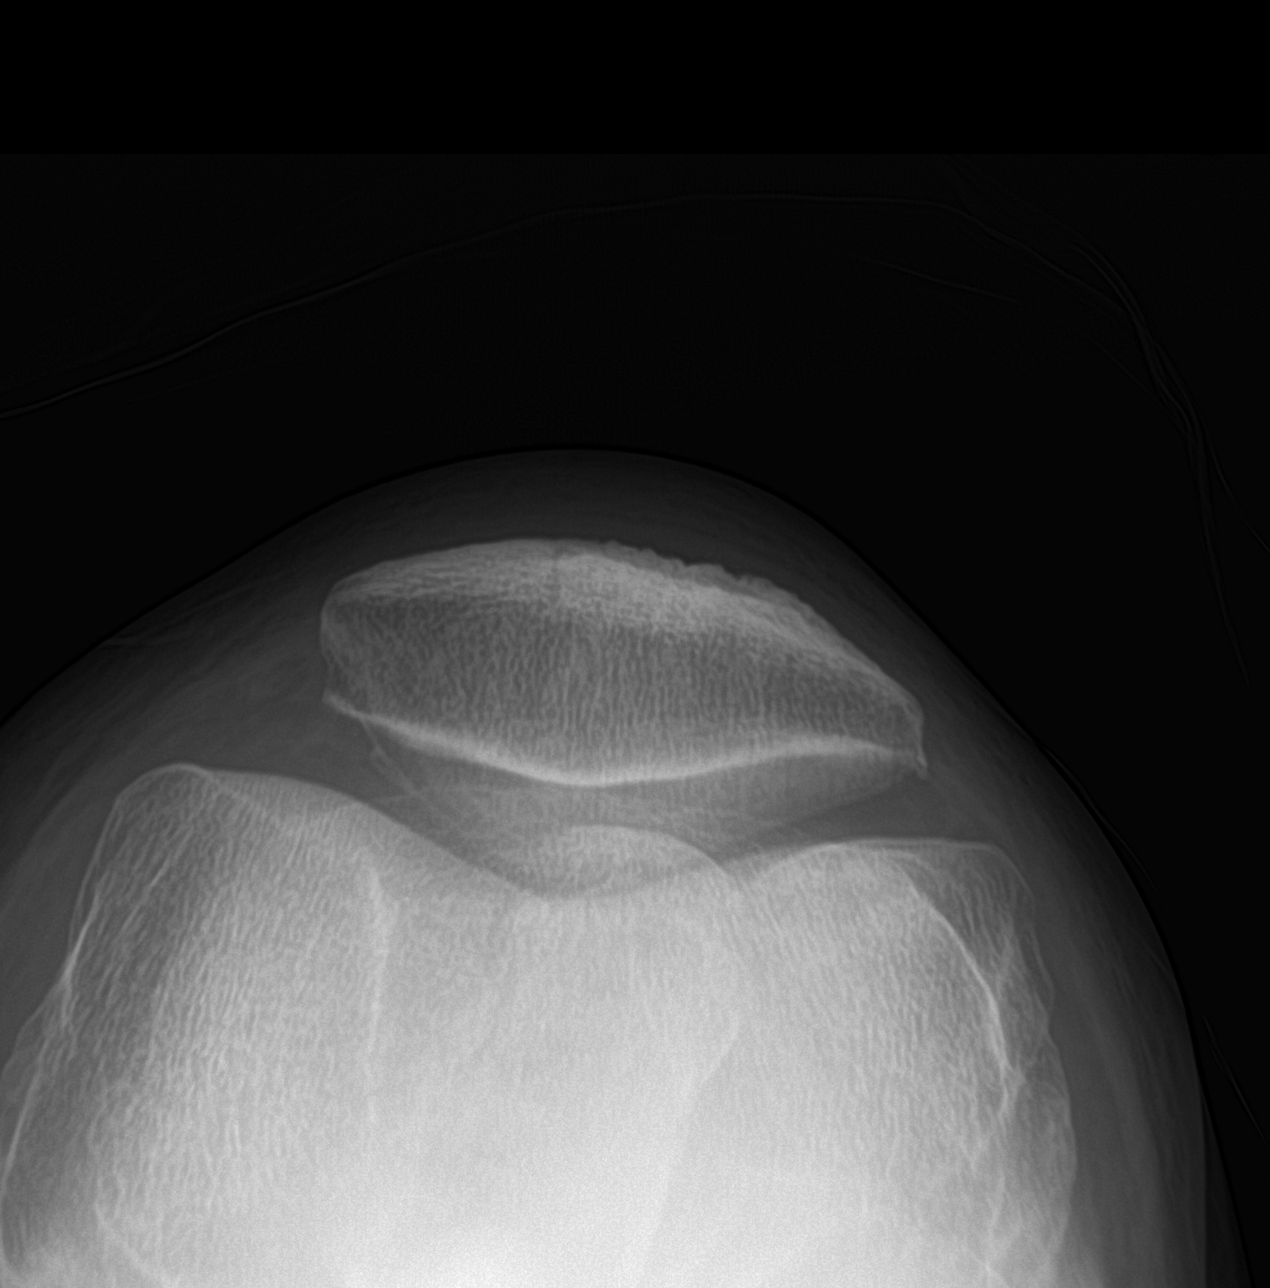

[4 of 4 positions shown; findings below may reference images not displayed]

FINDINGS: There is no acute fracture or dislocation. No significant arthritic
changes. The bones are well mineralized. No joint effusion. The soft
tissues are unremarkable.
IMPRESSION: Negative.

## 2022-07-08 ENCOUNTER — Other Ambulatory Visit: Payer: Self-pay

## 2022-07-08 DIAGNOSIS — B182 Chronic viral hepatitis C: Secondary | ICD-10-CM

## 2022-08-05 DIAGNOSIS — H5203 Hypermetropia, bilateral: Secondary | ICD-10-CM | POA: Diagnosis not present

## 2022-09-25 ENCOUNTER — Other Ambulatory Visit
Admission: RE | Admit: 2022-09-25 | Discharge: 2022-09-25 | Disposition: A | Discharge status: Home / Self Care | Payer: Commercial Managed Care - PPO | Source: Ambulatory Visit | Attending: Gastroenterology | Admitting: Gastroenterology

## 2022-09-25 DIAGNOSIS — B182 Chronic viral hepatitis C: Secondary | ICD-10-CM | POA: Diagnosis not present

## 2022-09-25 LAB — HEPATIC FUNCTION PANEL
ALT: 22 U/L (ref 0–44)
AST: 28 U/L (ref 15–41)
Albumin: 4.5 g/dL (ref 3.5–5.0)
Alkaline Phosphatase: 70 U/L (ref 38–126)
Bilirubin, Direct: 0.2 mg/dL (ref 0.0–0.2)
Indirect Bilirubin: 1.1 mg/dL — ABNORMAL HIGH (ref 0.3–0.9)
Total Bilirubin: 1.3 mg/dL — ABNORMAL HIGH (ref 0.3–1.2)
Total Protein: 7.6 g/dL (ref 6.5–8.1)

## 2022-09-26 LAB — HCV RNA QUANT: HCV Quantitative: NOT DETECTED IU/mL (ref >50)

## 2022-09-28 ENCOUNTER — Ambulatory Visit: Payer: Commercial Managed Care - PPO | Admitting: Gastroenterology

## 2022-09-28 ENCOUNTER — Other Ambulatory Visit: Payer: Self-pay

## 2022-09-28 ENCOUNTER — Encounter: Payer: Self-pay | Admitting: Gastroenterology

## 2022-09-28 VITALS — BP 128/78 | HR 80 | Temp 98.3°F | Wt 221.0 lb

## 2022-09-28 DIAGNOSIS — K7460 Unspecified cirrhosis of liver: Secondary | ICD-10-CM

## 2022-09-28 DIAGNOSIS — Z86010 Personal history of colonic polyps: Secondary | ICD-10-CM

## 2022-09-28 MED ORDER — NA SULFATE-K SULFATE-MG SULF 17.5-3.13-1.6 GM/177ML PO SOLN
1.0000 | Freq: Once | ORAL | 0 refills | Status: AC
  Filled 2022-09-28: qty 354, 1d supply, fill #0

## 2022-09-28 NOTE — Progress Notes (Signed)
    Primary Care Physician: Venita Lick, NP  Primary Gastroenterologist:  Dr. [name]  Chief Complaint  Patient presents with   Follow-up    HPI: Matthew Mercado is a 62 y.o. male here for follow-up after being treated for hepatitis C.  The patient has had a negative hepatitis C viral load in March of this year and a ultrasound in 2022 showed nodularity consistent with cirrhosis.  The patient's platelets and albumin have been normal.  The patient's FibroSure was also consistent with cirrhosis. The patient has stopped drinking and was feeling much better since being treated for hepatitis C and stopping his alcohol use.  The patient's wife works at the hospital in radiology specialty unit.  The patient's last colonoscopy was in 2019 with a repeat recommended in 5 years.  At that time he had 2 sessile serrated adenomas.  Past Medical History:  Diagnosis Date   Cervical spine degeneration    Cirrhosis of liver without ascites 04/15/2021   Depression    ETOH abuse 04/02/2021   Hepatitis C     Current Outpatient Medications  Medication Sig Dispense Refill   carvedilol (COREG) 3.125 MG tablet Take 2 tablets twice daily 120 tablet 11   cetirizine (ZYRTEC) 10 MG tablet Take 10 mg by mouth daily.     furosemide (LASIX) 40 MG tablet Take 1 tablet (40 mg total) by mouth daily. 90 tablet 3   Multiple Vitamins-Minerals (MENS MULTIVITAMIN PO) Take by mouth.     RIBOFLAVIN PO Take by mouth.     No current facility-administered medications for this visit.    Allergies as of 09/28/2022   (No Known Allergies)    ROS:  General: Negative for anorexia, weight loss, fever, chills, fatigue, weakness. ENT: Negative for hoarseness, difficulty swallowing , nasal congestion. CV: Negative for chest pain, angina, palpitations, dyspnea on exertion, peripheral edema.  Respiratory: Negative for dyspnea at rest, dyspnea on exertion, cough, sputum, wheezing.  GI: See history of present  illness. GU:  Negative for dysuria, hematuria, urinary incontinence, urinary frequency, nocturnal urination.  Endo: Negative for unusual weight change.    Physical Examination:   BP 128/78   Pulse 80   Temp 98.3 F (36.8 C) (Oral)   Wt 221 lb (100.2 kg)   BMI 31.71 kg/m   General: Well-nourished, well-developed in no acute distress.  Eyes: No icterus. Conjunctivae pink. Neuro: Alert and oriented x 3.  Grossly intact. Skin: Warm and dry, no jaundice.   Psych: Alert and cooperative, normal mood and affect.  Labs:    Imaging Studies: No results found.  Assessment and Plan:   Matthew Mercado is a 62 y.o. y/o male who comes in with a history of hepatitis C that was treated and he has been found to have hepatitis C viral load being negative.  This is a year after treatment.  The patient has also stopped drinking and has been feeling better.  Due to the patient cirrhosis the patient will need to have his alpha-fetoprotein checked and get a right upper quadrant ultrasound every 6 months for [facility] surveillance.  The patient has also been told that he is due for a colonoscopy in June.  The patient has been explained the plan and agrees with it.     [name], MD. [facility]    Note: This dictation was prepared with Dragon dictation along with smaller phrase technology. Any transcriptional errors that result from this process are unintentional.

## 2022-09-28 NOTE — Addendum Note (Signed)
Addended by: Lurlean Nanny on: 09/28/2022 05:08 PM   Modules accepted: Orders

## 2022-09-28 NOTE — Patient Instructions (Addendum)
If you need to cancel or reschedule ultrasound, please call scheduling directly at [phone]  Ultrasound scheduled for October 05, 2022, arrive at 8:45AM to register at [place] entrance  You can not have anything to eat or drink after 12 midnight the day before ultrasound

## 2022-09-29 LAB — AFP TUMOR MARKER: AFP, Serum, Tumor Marker: 4.7 ng/mL (ref 0.0–8.4)

## 2022-09-30 ENCOUNTER — Encounter: Payer: Self-pay | Admitting: Gastroenterology

## 2022-10-05 ENCOUNTER — Ambulatory Visit
Admission: RE | Admit: 2022-10-05 | Discharge: 2022-10-05 | Disposition: A | Discharge status: Home / Self Care | Payer: Commercial Managed Care - PPO | Source: Ambulatory Visit | Attending: Gastroenterology | Admitting: Gastroenterology

## 2022-10-05 DIAGNOSIS — K7460 Unspecified cirrhosis of liver: Secondary | ICD-10-CM | POA: Insufficient documentation

## 2022-10-05 DIAGNOSIS — Z9049 Acquired absence of other specified parts of digestive tract: Secondary | ICD-10-CM | POA: Diagnosis not present

## 2022-10-06 ENCOUNTER — Other Ambulatory Visit: Payer: Self-pay

## 2022-10-06 ENCOUNTER — Other Ambulatory Visit (HOSPITAL_COMMUNITY): Payer: Self-pay

## 2022-10-07 ENCOUNTER — Other Ambulatory Visit: Payer: Self-pay | Admitting: Gastroenterology

## 2022-10-07 ENCOUNTER — Other Ambulatory Visit: Payer: Self-pay

## 2022-10-07 ENCOUNTER — Encounter: Payer: Self-pay | Admitting: Gastroenterology

## 2022-10-08 ENCOUNTER — Other Ambulatory Visit: Payer: Self-pay

## 2022-10-08 MED ORDER — CARVEDILOL 3.125 MG PO TABS
6.2500 mg | ORAL_TABLET | Freq: Two times a day (BID) | ORAL | 11 refills | Status: DC
  Filled 2022-10-08: qty 120, 30d supply, fill #0
  Filled 2022-12-14: qty 120, 30d supply, fill #1
  Filled 2023-03-08: qty 120, 30d supply, fill #2
  Filled 2023-05-05: qty 120, 30d supply, fill #3
  Filled 2023-08-10: qty 120, 30d supply, fill #4

## 2022-10-08 MED ORDER — FUROSEMIDE 40 MG PO TABS
40.0000 mg | ORAL_TABLET | Freq: Every day | ORAL | 3 refills | Status: DC
  Filled 2022-10-08: qty 90, 90d supply, fill #0
  Filled 2023-02-08: qty 90, 90d supply, fill #1
  Filled 2023-05-05: qty 90, 90d supply, fill #2

## 2022-10-19 IMAGING — DX DG SHOULDER 2+V*L*
3 series · 3 of 3 positions shown · non-contrast
Comparison: None.

CLINICAL DATA: Left shoulder pain

EXAM:
LEFT SHOULDER - 3 VIEW

[shoulder ap]
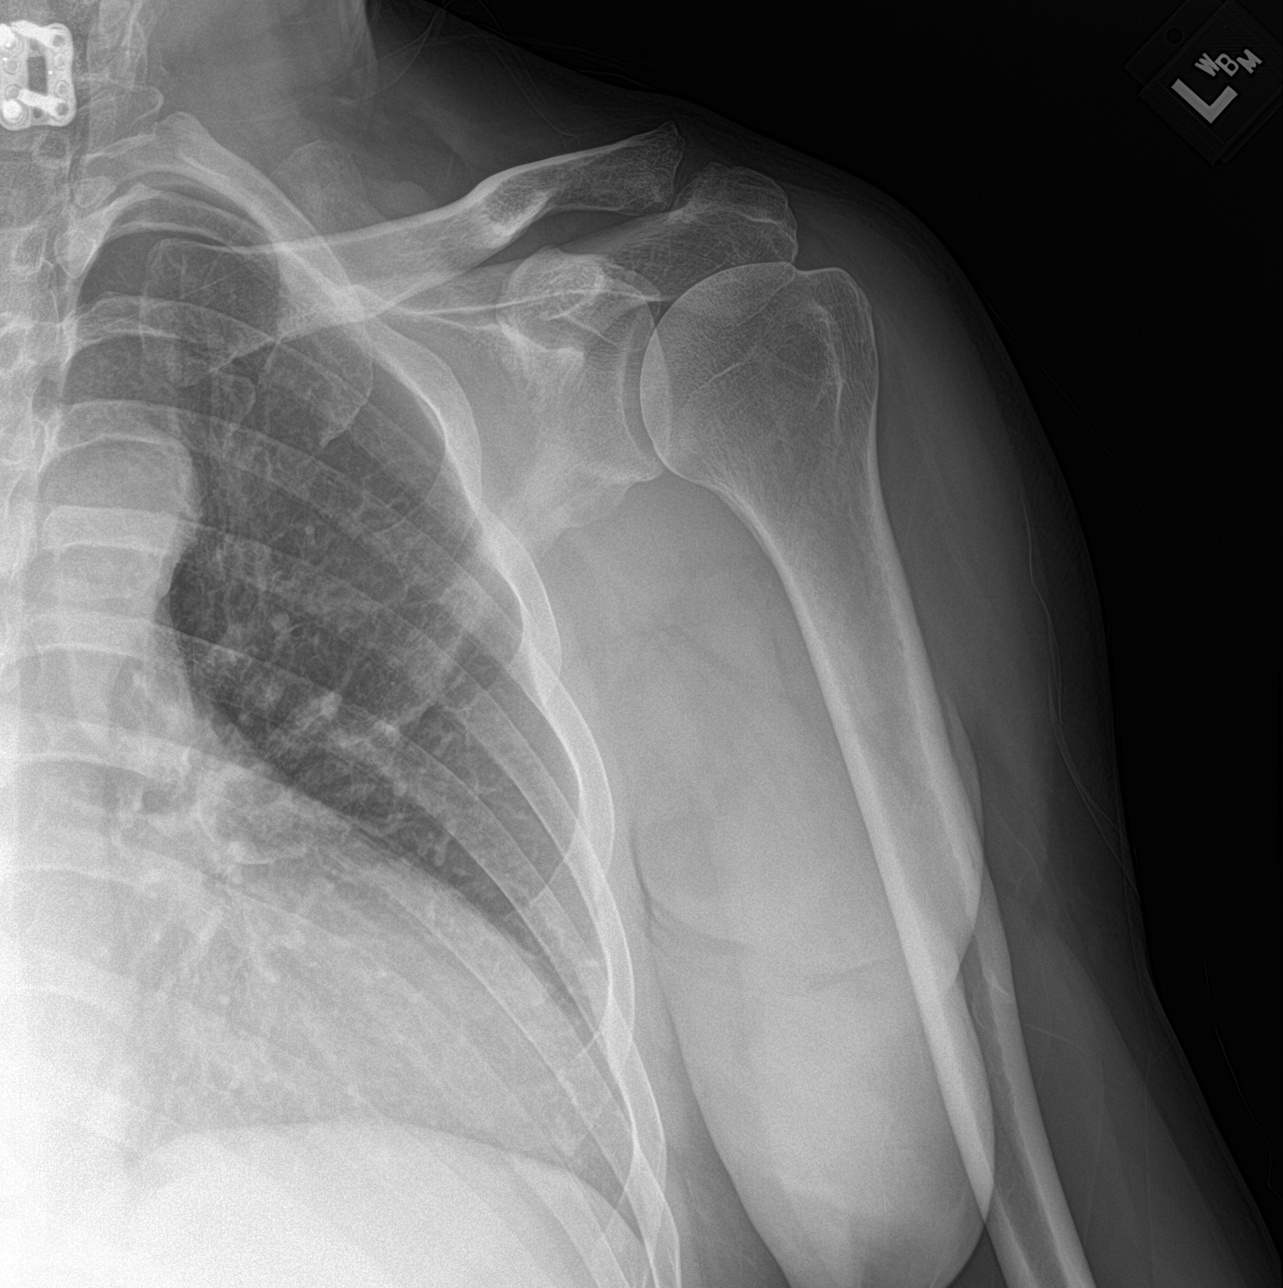

[shoulder y-view]
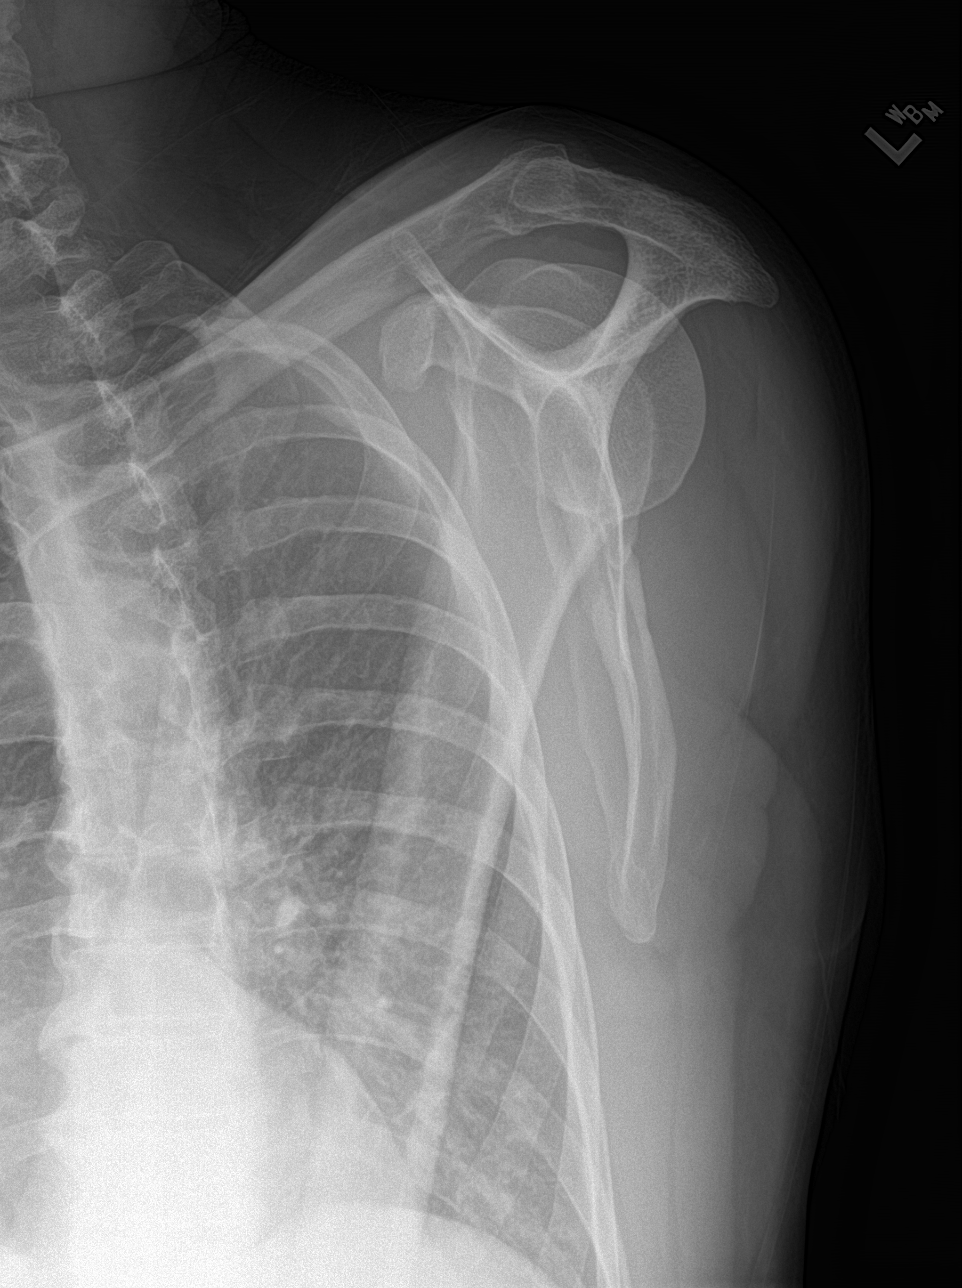

[shoulder axial]
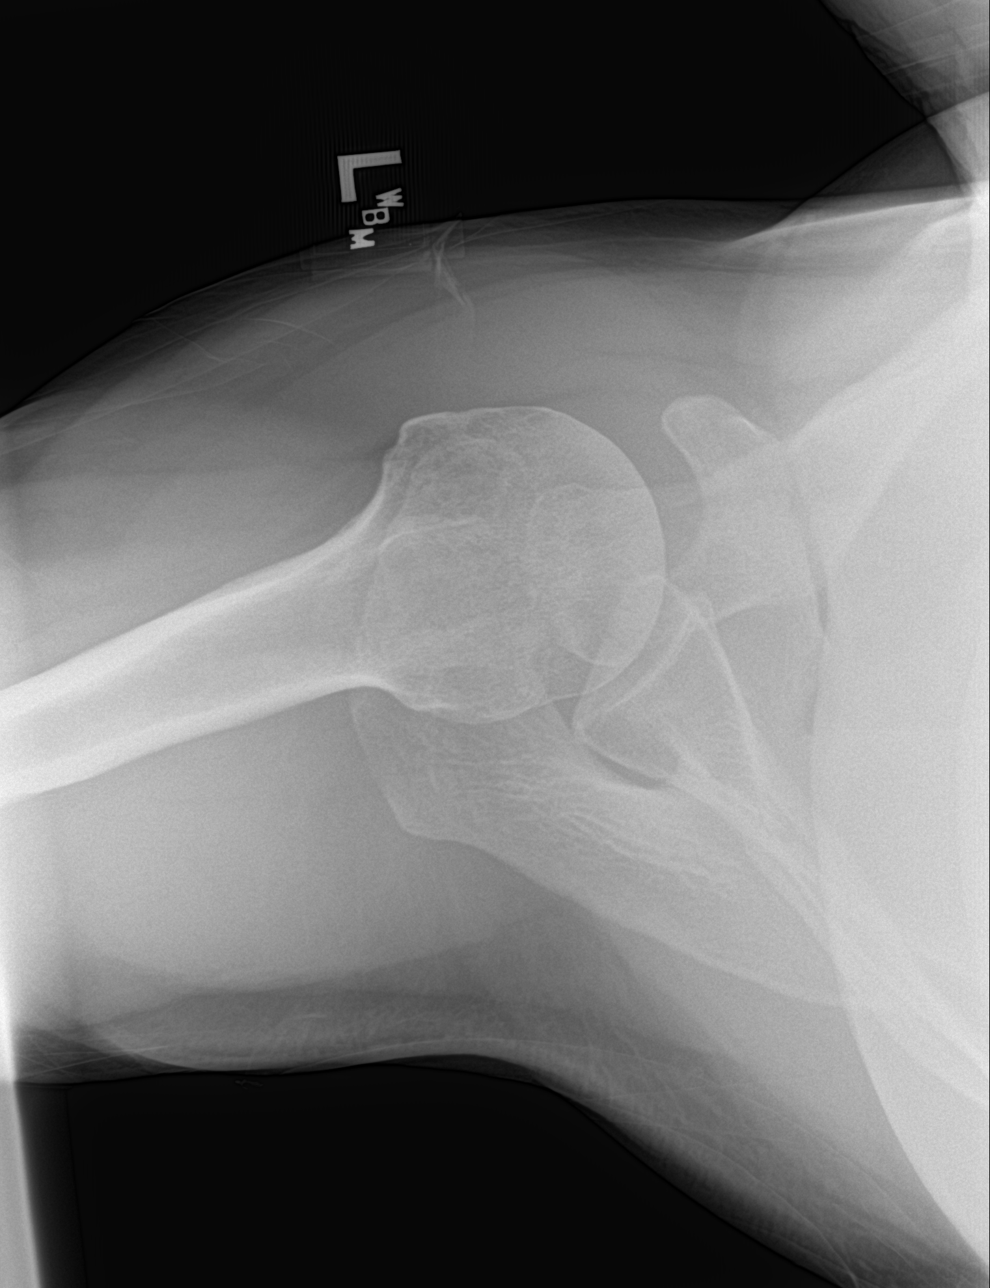

[3 of 3 positions shown; findings below may reference images not displayed]

FINDINGS: There is no evidence of fracture or dislocation. Minimal
degenerative changes of the acromioclavicular joint. Soft tissues
are unremarkable.
IMPRESSION: No acute osseous abnormality.

## 2022-10-25 DIAGNOSIS — E781 Pure hyperglyceridemia: Secondary | ICD-10-CM | POA: Insufficient documentation

## 2022-10-25 NOTE — Patient Instructions (Signed)

## 2022-10-27 ENCOUNTER — Encounter: Payer: Self-pay | Admitting: Nurse Practitioner

## 2022-10-27 ENCOUNTER — Ambulatory Visit: Payer: Commercial Managed Care - PPO | Admitting: Nurse Practitioner

## 2022-10-27 VITALS — BP 130/83 | HR 71 | Temp 98.2°F | Ht 70.0 in | Wt 221.2 lb

## 2022-10-27 DIAGNOSIS — K739 Chronic hepatitis, unspecified: Secondary | ICD-10-CM

## 2022-10-27 DIAGNOSIS — R7301 Impaired fasting glucose: Secondary | ICD-10-CM

## 2022-10-27 DIAGNOSIS — K7030 Alcoholic cirrhosis of liver without ascites: Secondary | ICD-10-CM | POA: Diagnosis not present

## 2022-10-27 DIAGNOSIS — F1011 Alcohol abuse, in remission: Secondary | ICD-10-CM | POA: Diagnosis not present

## 2022-10-27 DIAGNOSIS — E781 Pure hyperglyceridemia: Secondary | ICD-10-CM | POA: Diagnosis not present

## 2022-10-27 DIAGNOSIS — R7989 Other specified abnormal findings of blood chemistry: Secondary | ICD-10-CM

## 2022-10-27 DIAGNOSIS — F1010 Alcohol abuse, uncomplicated: Secondary | ICD-10-CM

## 2022-10-27 NOTE — Assessment & Plan Note (Signed)
Noted on past labs, check A1c at physical and adjust regimen as needed.  Focus on diet changes and regular exercise.

## 2022-10-27 NOTE — Assessment & Plan Note (Signed)
Chronic, ongoing.  Followed by GI.  Will continue this collaboration.  Recent notes and labs reviewed.  Recheck labs at physical.

## 2022-10-27 NOTE — Assessment & Plan Note (Signed)
Noted on past labs, no current medications.  Recheck labs at physical.

## 2022-10-27 NOTE — Assessment & Plan Note (Signed)
Chronic, ongoing.  Continue collaboration with GI.  Recent notes reviewed.  Recheck labs at physical.

## 2022-10-27 NOTE — Assessment & Plan Note (Signed)
Has been sober for 1 1/2 years, praised for this.

## 2022-10-27 NOTE — Progress Notes (Signed)
BP 130/83   Pulse 71   Temp 98.2 F (36.8 C) (Oral)   Ht 5\' 10"  (1.778 m)   Wt 221 lb 3.2 oz (100.3 kg)   SpO2 95%   BMI 31.74 kg/m    Subjective:    Patient ID: Matthew Mercado, male    DOB: 14-Sep-1960, 62 y.o.   MRN: [id number]  HPI: Matthew Mercado is a 62 y.o. male  Chief Complaint  Patient presents with   Elevated LFT   IFG   CIRRHOSIS & CHRONIC HEPATITIS Followed with Dr. Servando Snare at GI, last visit 09/28/22 -- imaging stable on 10/05/22.  Continues on Lasix daily and Carvedilol.  On the 22nd of October 2024 will be two years alcohol free.  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  Has colonoscopy on June 18th scheduled.    Recent A1c was stable without prediabetes.  Refuses labs today. Duration:months Treatments attempted: as above Fever: no Nausea: no Vomiting: no Weight loss: no Decreased appetite: no Diarrhea: no Constipation: no Blood in stool: no Heartburn: no Jaundice: no Rash: no     10/27/2022    2:08 PM 06/18/2022    1:55 PM 04/28/2022    2:36 PM 09/09/2021   10:08 AM 06/19/2021    3:54 PM  Depression screen PHQ 2/9  Decreased Interest 0 0 0 0 0  Down, Depressed, Hopeless 0 0 0 0 0  PHQ - 2 Score 0 0 0 0 0  Altered sleeping 0 0 0 1 1  Tired, decreased energy 0 0 0 1 1  Change in appetite 0 0 1 1 0  Feeling bad or failure about yourself  0 0 0 0 0  Trouble concentrating 0 0 0 0 0  Moving slowly or fidgety/restless 0 0 0 0 0  Suicidal thoughts 0 0 0 0 0  PHQ-9 Score 0 0 1 3 2   Difficult doing work/chores  Not difficult at all Not difficult at all  Not difficult at all       10/27/2022    2:08 PM 06/18/2022    1:55 PM 04/28/2022    2:36 PM 09/09/2021   10:08 AM  GAD 7 : Generalized Anxiety Score  Nervous, Anxious, on Edge 0 0 0 0  Control/stop worrying 0 0 0 0  Worry too much - different things 0 0 0 1  Trouble relaxing 0 0 0 1  Restless  0 0 0 0  Easily annoyed or irritable 0 0 0 0  Afraid - awful might happen 0 0 0 0  Total GAD 7 Score 0 0 0 2  Anxiety Difficulty Not difficult at all Not difficult at all Not difficult at all Not difficult at all     Relevant past medical, surgical, family and social history reviewed and updated as indicated. Interim medical history since our last visit reviewed. Allergies and medications reviewed and updated.  Review of Systems  Constitutional:  Negative for activity change, diaphoresis, fatigue and fever.  Respiratory:  Negative for cough, chest tightness, shortness of breath and wheezing.   Cardiovascular:  Negative for chest pain, palpitations and leg swelling.  Gastrointestinal: Negative.   Endocrine: Negative.   Neurological: Negative.   Psychiatric/Behavioral: Negative.     Per HPI unless specifically indicated above     Objective:    BP 130/83   Pulse 71   Temp 98.2 F (  36.8 C) (Oral)   Ht 5\' 10"  (1.778 m)   Wt 221 lb 3.2 oz (100.3 kg)   SpO2 95%   BMI 31.74 kg/m   Wt Readings from Last 3 Encounters:  10/27/22 221 lb 3.2 oz (100.3 kg)  09/28/22 221 lb (100.2 kg)  06/18/22 217 lb 3.2 oz (98.5 kg)    Physical Exam Vitals and nursing note reviewed.  Constitutional:      General: He is awake. He is not in acute distress.    Appearance: He is well-developed and well-groomed. He is obese. He is not ill-appearing or toxic-appearing.  HENT:     Head: Normocephalic and atraumatic.     Right Ear: Hearing normal. No drainage.     Left Ear: Hearing normal. No drainage.  Eyes:     General: Lids are normal.        Right eye: No discharge.        Left eye: No discharge.     Conjunctiva/sclera: Conjunctivae normal.     Pupils: Pupils are equal, round, and reactive to light.  Neck:     Thyroid: No thyromegaly.     Vascular: No carotid bruit.  Cardiovascular:     Rate and Rhythm: Normal rate and regular rhythm.     Heart sounds: Normal heart sounds, S1 normal and S2  normal. No murmur heard.    No gallop.  Pulmonary:     Effort: Pulmonary effort is normal.     Breath sounds: Normal breath sounds.  Abdominal:     General: Bowel sounds are normal. There is no distension.     Palpations: Abdomen is soft.     Tenderness: There is no abdominal tenderness.  Musculoskeletal:        General: Normal range of motion.     Cervical back: Normal range of motion and neck supple.     Right lower leg: No edema.     Left lower leg: No edema.  Skin:    General: Skin is warm and dry.     Capillary Refill: Capillary refill takes less than 2 seconds.  Neurological:     Mental Status: He is alert and oriented to person, place, and time.     Deep Tendon Reflexes: Reflexes are normal and symmetric.  Psychiatric:        Mood and Affect: Mood normal.        Behavior: Behavior normal. Behavior is cooperative.        Thought Content: Thought content normal.        Judgment: Judgment normal.    Results for orders placed or performed in visit on 09/28/22  AFP tumor marker  Result Value Ref Range   AFP, Serum, Tumor Marker 4.7 0.0 - 8.4 ng/mL      Assessment & Plan:   Problem List Items Addressed This Visit       Digestive   Chronic hepatitis (HCC)    Chronic, ongoing.  Followed by GI.  Will continue this collaboration.  Recent notes and labs reviewed.  Recheck labs at physical.      Cirrhosis of liver without ascites (HCC) - Primary    Chronic, ongoing.  Continue collaboration with GI.  Recent notes reviewed.  Recheck labs at physical.        Endocrine   IFG (impaired fasting glucose)    Noted on past labs, check A1c at physical and adjust regimen as needed.  Focus on diet changes and regular exercise.  Other   History of alcohol abuse    Has been sober for 1 1/2 years, praised for this.      Hypertriglyceridemia without hypercholesterolemia    Noted on past labs, no current medications.  Recheck labs at physical.        Follow up  plan: Return in about 6 months (around 05/03/2023) for Annual Physical -- after 04/29/23 and schedule labs beforehand.

## 2022-12-14 ENCOUNTER — Other Ambulatory Visit: Payer: Self-pay

## 2022-12-14 ENCOUNTER — Encounter: Payer: Self-pay | Admitting: Gastroenterology

## 2022-12-15 ENCOUNTER — Other Ambulatory Visit: Payer: Self-pay

## 2022-12-15 ENCOUNTER — Encounter: Payer: Self-pay | Admitting: Gastroenterology

## 2022-12-15 ENCOUNTER — Ambulatory Visit: Payer: Commercial Managed Care - PPO

## 2022-12-15 ENCOUNTER — Ambulatory Visit
Admission: RE | Admit: 2022-12-15 | Discharge: 2022-12-15 | Disposition: A | Discharge status: Home / Self Care | Payer: Commercial Managed Care - PPO | Attending: Gastroenterology | Admitting: Gastroenterology

## 2022-12-15 ENCOUNTER — Encounter
Admission: RE | Disposition: A | Discharge status: Home / Self Care | Payer: Self-pay | Source: Home / Self Care | Attending: Gastroenterology

## 2022-12-15 DIAGNOSIS — Z860100 Personal history of colon polyps, unspecified: Secondary | ICD-10-CM

## 2022-12-15 DIAGNOSIS — K635 Polyp of colon: Secondary | ICD-10-CM | POA: Diagnosis not present

## 2022-12-15 DIAGNOSIS — Z87891 Personal history of nicotine dependence: Secondary | ICD-10-CM | POA: Insufficient documentation

## 2022-12-15 DIAGNOSIS — K640 First degree hemorrhoids: Secondary | ICD-10-CM | POA: Insufficient documentation

## 2022-12-15 DIAGNOSIS — Z1211 Encounter for screening for malignant neoplasm of colon: Secondary | ICD-10-CM | POA: Insufficient documentation

## 2022-12-15 DIAGNOSIS — E781 Pure hyperglyceridemia: Secondary | ICD-10-CM | POA: Diagnosis not present

## 2022-12-15 HISTORY — PX: COLONOSCOPY WITH PROPOFOL: SHX5780

## 2022-12-15 HISTORY — PX: POLYPECTOMY: SHX5525

## 2022-12-15 SURGERY — COLONOSCOPY WITH PROPOFOL
Anesthesia: General

## 2022-12-15 MED ORDER — LIDOCAINE HCL (CARDIAC) PF 100 MG/5ML IV SOSY
PREFILLED_SYRINGE | INTRAVENOUS | Status: DC | PRN
  Administered 2022-12-15: 60 mg via INTRAVENOUS

## 2022-12-15 MED ORDER — DEXMEDETOMIDINE HCL IN NACL 80 MCG/20ML IV SOLN
INTRAVENOUS | Status: DC | PRN
  Administered 2022-12-15: 8 ug via INTRAVENOUS

## 2022-12-15 MED ORDER — PROPOFOL 10 MG/ML IV BOLUS
INTRAVENOUS | Status: DC | PRN
  Administered 2022-12-15: 20 mg via INTRAVENOUS
  Administered 2022-12-15: 30 mg via INTRAVENOUS
  Administered 2022-12-15: 100 mg via INTRAVENOUS
  Administered 2022-12-15 (×3): 20 mg via INTRAVENOUS

## 2022-12-15 MED ORDER — SODIUM CHLORIDE 0.9 % IV SOLN: INTRAVENOUS | Status: DC

## 2022-12-15 NOTE — H&P (Signed)
Midge Minium, MD [facility] [address], [address] [place], [place] [address] Phone:[phone] Fax : [phone]  Primary Care Physician:  Marjie Skiff, NP Primary Gastroenterologist:  Dr. Servando Snare  Pre-Procedure History & Physical: HPI:  Matthew Mercado is a 62 y.o. male is here for an colonoscopy.   Past Medical History:  Diagnosis Date   Cervical spine degeneration    Cirrhosis of liver without ascites (HCC) 04/15/2021   Depression    ETOH abuse 04/02/2021   Hepatitis C     Past Surgical History:  Procedure Laterality Date   CHOLECYSTECTOMY     COLONOSCOPY WITH PROPOFOL N/A 12/08/2017   Procedure: COLONOSCOPY WITH PROPOFOL;  Surgeon: Midge Minium, MD;  Location: [facility];  Service: Endoscopy;  Laterality: N/A;   ESOPHAGOGASTRODUODENOSCOPY (EGD) WITH PROPOFOL N/A 05/26/2021   Procedure: ESOPHAGOGASTRODUODENOSCOPY (EGD) WITH PROPOFOL;  Surgeon: Midge Minium, MD;  Location: [facility];  Service: Endoscopy;  Laterality: N/A;   SPINE SURGERY     discectomy and fusion C 4/5   TONSILLECTOMY      Prior to Admission medications   Medication Sig Start Date End Date Taking? Authorizing Provider  carvedilol (COREG) 3.125 MG tablet Take 2 tablets (6.25 mg total) by mouth 2 (two) times daily. 10/08/22  Yes Midge Minium, MD  cetirizine (ZYRTEC) 10 MG tablet Take 10 mg by mouth daily.   Yes [provider]  furosemide (LASIX) 40 MG tablet Take 1 tablet (40 mg total) by mouth daily. 10/08/22  Yes Midge Minium, MD  Multiple Vitamins-Minerals (MENS MULTIVITAMIN PO) Take by mouth.   Yes [provider]  RIBOFLAVIN PO Take by mouth.   Yes [provider]    Allergies as of 09/29/2022   (No Known Allergies)    Family History  Problem Relation Age of Onset   Cancer Mother    Cancer Father        prostate   Hypertension Father    Sleep apnea Sister    Hypertension Sister    Drug abuse Sister     Social History   Socioeconomic History    Marital status: Married    Spouse name: Not on file   Number of children: Not on file   Years of education: Not on file   Highest education level: Not on file  Occupational History   Not on file  Tobacco Use   Smoking status: Former    Types: Cigarettes    Quit date: 11/12/2010    Years since quitting: 12.0   Smokeless tobacco: Never  Vaping Use   Vaping Use: Never used  Substance and Sexual Activity   Alcohol use: Not Currently   Drug use: No    Comment: Used in the past   Sexual activity: Yes  Other Topics Concern   Not on file  Social History Narrative   Not on file   Social Determinants of Health   Financial Resource Strain: Low Risk  (04/21/2021)   Overall Financial Resource Strain (CARDIA)    Difficulty of Paying Living Expenses: Not hard at all  Food Insecurity: No Food Insecurity (04/21/2021)   Hunger Vital Sign    Worried About Running Out of Food in the Last Year: Never true    Ran Out of Food in the Last Year: Never true  Transportation Needs: No Transportation Needs (04/21/2021)   PRAPARE - Administrator, Civil Service (Medical): No    Lack of Transportation (Non-Medical): No  Physical Activity: Not on  file  Stress: No Stress Concern Present (04/21/2021)   [employer] of Occupational Health - Occupational Stress Questionnaire    Feeling of Stress : Not at all  Social Connections: Not on file  Intimate Partner Violence: Not on file    Review of Systems: See HPI, otherwise negative ROS  Physical Exam: BP 128/85   Pulse (!) 58   Temp (!) 96.5 F (35.8 C) (Temporal)   Resp 18   Ht 5\' 11"  (1.803 m)   Wt 97.1 kg   SpO2 97%   BMI 29.85 kg/m  General:   Alert,  pleasant and cooperative in NAD Head:  Normocephalic and atraumatic. Neck:  Supple; no masses or thyromegaly. Lungs:  Clear throughout to auscultation.    Heart:  Regular rate and rhythm. Abdomen:  Soft, nontender and nondistended. Normal bowel sounds, without guarding,  and without rebound.   Neurologic:  Alert and  oriented x4;  grossly normal neurologically.  Impression/Plan: Matthew Mercado is here for an colonoscopy to be performed for a history of adenomatous polyps on 2019   Risks, benefits, limitations, and alternatives regarding  colonoscopy have been reviewed with the patient.  Questions have been answered.  All parties agreeable.   Midge Minium, MD  12/15/2022, 8:43 AM

## 2022-12-15 NOTE — Anesthesia Preprocedure Evaluation (Signed)
Anesthesia Evaluation  Patient identified by MRN, date of birth, ID band Patient awake    Reviewed: Allergy & Precautions, NPO status , Patient's Chart, lab work & pertinent test results  History of Anesthesia Complications Negative for: history of anesthetic complications  Airway Mallampati: III  TM Distance: <3 FB Neck ROM: full    Dental  (+) Chipped, Poor Dentition   Pulmonary neg shortness of breath, former smoker   Pulmonary exam normal        Cardiovascular Exercise Tolerance: Good (-) angina negative cardio ROS Normal cardiovascular exam     Neuro/Psych negative neurological ROS  negative psych ROS   GI/Hepatic negative GI ROS,neg GERD  ,,(+) Hepatitis -  Endo/Other  negative endocrine ROS    Renal/GU negative Renal ROS  negative genitourinary   Musculoskeletal   Abdominal   Peds  Hematology negative hematology ROS (+)   Anesthesia Other Findings Past Medical History: No date: Cervical spine degeneration 04/15/2021: Cirrhosis of liver without ascites (HCC) No date: Depression 04/02/2021: ETOH abuse No date: Hepatitis C  Past Surgical History: No date: CHOLECYSTECTOMY 12/08/2017: COLONOSCOPY WITH PROPOFOL; N/A     Comment:  Procedure: COLONOSCOPY WITH PROPOFOL;  Surgeon: Midge Minium, MD;  Location: [facility];  Service:               Endoscopy;  Laterality: N/A; 05/26/2021: ESOPHAGOGASTRODUODENOSCOPY (EGD) WITH PROPOFOL; N/A     Comment:  Procedure: ESOPHAGOGASTRODUODENOSCOPY (EGD) WITH               PROPOFOL;  Surgeon: Midge Minium, MD;  Location: [facility];  Service: Endoscopy;  Laterality: N/A; No date: SPINE SURGERY     Comment:  discectomy and fusion C 4/5 No date: TONSILLECTOMY  BMI    Body Mass Index: 29.85 kg/m      Reproductive/Obstetrics negative OB ROS                             Anesthesia  Physical Anesthesia Plan  ASA: 3  Anesthesia Plan: General   Post-op Pain Management:    Induction: Intravenous  PONV Risk Score and Plan: Propofol infusion and TIVA  Airway Management Planned: Natural Airway and Nasal Cannula  Additional Equipment:   Intra-op Plan:   Post-operative Plan:   Informed Consent: I have reviewed the patients History and Physical, chart, labs and discussed the procedure including the risks, benefits and alternatives for the proposed anesthesia with the patient or authorized representative who has indicated his/her understanding and acceptance.     Dental Advisory Given  Plan Discussed with: Anesthesiologist, CRNA and Surgeon  Anesthesia Plan Comments: (Patient consented for risks of anesthesia including but not limited to:  - adverse reactions to medications - risk of airway placement if required - damage to eyes, teeth, lips or other oral mucosa - nerve damage due to positioning  - sore throat or hoarseness - Damage to heart, brain, nerves, lungs, other parts of body or loss of life  Patient voiced understanding.)       Anesthesia Quick Evaluation

## 2022-12-15 NOTE — Transfer of Care (Signed)
Immediate Anesthesia Transfer of Care Note  Patient: Matthew Mercado  Procedure(s) Performed: COLONOSCOPY WITH PROPOFOL  Patient Location: PACU  Anesthesia Type:General  Level of Consciousness: drowsy  Airway & Oxygen Therapy: Patient Spontanous Breathing and Patient connected to nasal cannula oxygen  Post-op Assessment: Report given to RN and Post -op Vital signs reviewed and stable  Post vital signs: Reviewed and stable  Last Vitals:  Vitals Value Taken Time  BP 110/75 12/15/22 0910  Temp    Pulse 67 12/15/22 0910  Resp 12 12/15/22 0910  SpO2 98 % 12/15/22 0910  Vitals shown include unvalidated device data.  Last Pain:  Vitals:   12/15/22 0910  TempSrc:   PainSc: 0-No pain         Complications: No notable events documented.

## 2022-12-15 NOTE — Anesthesia Postprocedure Evaluation (Signed)
Anesthesia Post Note  Patient: Matthew Mercado  Procedure(s) Performed: COLONOSCOPY WITH PROPOFOL  Patient location during evaluation: Endoscopy Anesthesia Type: General Level of consciousness: awake and alert Pain management: pain level controlled Vital Signs Assessment: post-procedure vital signs reviewed and stable Respiratory status: spontaneous breathing, nonlabored ventilation, respiratory function stable and patient connected to nasal cannula oxygen Cardiovascular status: blood pressure returned to baseline and stable Postop Assessment: no apparent nausea or vomiting Anesthetic complications: no   No notable events documented.   Last Vitals:  Vitals:   12/15/22 0931 12/15/22 0934  BP:  110/74  Pulse:    Resp:    Temp: (!) 35.8 C   SpO2:      Last Pain:  Vitals:   12/15/22 0934  TempSrc:   PainSc: 0-No pain                 Cleda Mccreedy Lanna Labella

## 2022-12-15 NOTE — Op Note (Signed)
[facility] Gastroenterology Patient Name: Matthew Mercado Procedure Date: 12/15/2022 8:39 AM MRN: [id number] Account #: [id number] Date of Birth: Jan 16, 1961 Admit Type: Outpatient Age: 62 Room: [facility] ENDO ROOM 3 Gender: Male Note Status: Finalized Instrument Name: Nelda Marseille [id number] Procedure:             Colonoscopy Indications:           High risk colon cancer surveillance: Personal history                         of colonic polyps Providers:             Midge Minium MD, MD Medicines:             Propofol per Anesthesia Complications:         No immediate complications. Procedure:             Pre-Anesthesia Assessment:                        - Prior to the procedure, a History and Physical was                         performed, and patient medications and allergies were                         reviewed. The patient's tolerance of previous                         anesthesia was also reviewed. The risks and benefits                         of the procedure and the sedation options and risks                         were discussed with the patient. All questions were                         answered, and informed consent was obtained. Prior                         Anticoagulants: The patient has taken no anticoagulant                         or antiplatelet agents. ASA Grade Assessment: II - A                         patient with mild systemic disease. After reviewing                         the risks and benefits, the patient was deemed in                         satisfactory condition to undergo the procedure.                        After obtaining informed consent, the colonoscope was                         passed under direct vision. Throughout  the procedure,                         the patient's blood pressure, pulse, and oxygen                         saturations were monitored continuously. The                         Colonoscope was introduced through the  anus and                         advanced to the the cecum, identified by appendiceal                         orifice and ileocecal valve. The colonoscopy was                         performed without difficulty. The patient tolerated                         the procedure well. The quality of the bowel                         preparation was excellent. Findings:      The perianal and digital rectal examinations were normal.      A 3 mm polyp was found in the descending colon. The polyp was sessile.       The polyp was removed with a cold snare. Resection and retrieval were       complete.      Non-bleeding internal hemorrhoids were found during retroflexion. The       hemorrhoids were Grade I (internal hemorrhoids that do not prolapse). Impression:            - One 3 mm polyp in the descending colon, removed with                         a cold snare. Resected and retrieved.                        - Non-bleeding internal hemorrhoids. Recommendation:        - Discharge patient to home.                        - Resume previous diet.                        - Continue present medications.                        - Await pathology results.                        - Repeat colonoscopy in 7 years for surveillance. Procedure Code(s):     --- Professional ---                        [phone], Colonoscopy, flexible; with removal of  tumor(s), polyp(s), or other lesion(s) by snare                         technique Diagnosis Code(s):     --- Professional ---                        Z86.010, Personal history of colonic polyps                        D12.4, Benign neoplasm of descending colon CPT copyright 2022 American Medical Association. All rights reserved. The codes documented in this report are preliminary and upon coder review may  be revised to meet current compliance requirements. Midge Minium MD, MD 12/15/2022 9:07:48 AM This report has been signed electronically. Number of  Addenda: 0 Note Initiated On: 12/15/2022 8:39 AM Scope Withdrawal Time: 0 hours 6 minutes 27 seconds  Total Procedure Duration: 0 hours 12 minutes 13 seconds  Estimated Blood Loss:  Estimated blood loss: none.      [facility]

## 2022-12-16 ENCOUNTER — Encounter: Payer: Self-pay | Admitting: Gastroenterology

## 2022-12-17 ENCOUNTER — Encounter: Payer: Self-pay | Admitting: Gastroenterology

## 2023-02-08 ENCOUNTER — Other Ambulatory Visit: Payer: Self-pay

## 2023-02-09 ENCOUNTER — Other Ambulatory Visit: Payer: Self-pay

## 2023-03-05 ENCOUNTER — Encounter: Payer: Self-pay | Admitting: Nurse Practitioner

## 2023-03-05 ENCOUNTER — Other Ambulatory Visit: Payer: Self-pay

## 2023-03-05 ENCOUNTER — Ambulatory Visit: Payer: Commercial Managed Care - PPO | Admitting: Nurse Practitioner

## 2023-03-05 VITALS — BP 116/70 | HR 66 | Temp 98.1°F | Wt 222.4 lb

## 2023-03-05 DIAGNOSIS — R051 Acute cough: Secondary | ICD-10-CM

## 2023-03-05 DIAGNOSIS — R059 Cough, unspecified: Secondary | ICD-10-CM | POA: Insufficient documentation

## 2023-03-05 MED ORDER — AZITHROMYCIN 250 MG PO TABS
ORAL_TABLET | ORAL | 0 refills | Status: AC
  Filled 2023-03-05: qty 6, 5d supply, fill #0

## 2023-03-05 MED ORDER — PREDNISONE 20 MG PO TABS
40.0000 mg | ORAL_TABLET | Freq: Every day | ORAL | 0 refills | Status: AC
  Filled 2023-03-05: qty 10, 5d supply, fill #0

## 2023-03-05 NOTE — Patient Instructions (Signed)
Acute Bronchitis, Adult  Acute bronchitis is when air tubes in the lungs (bronchi) suddenly get swollen. The condition can make it hard for you to breathe. In adults, acute bronchitis usually goes away within 2 weeks. A cough caused by bronchitis may last up to 3 weeks. Smoking, allergies, and asthma can make the condition worse. What are the causes? Germs that cause cold and flu (viruses). The most common cause of this condition is the virus that causes the common cold. Bacteria. Substances that bother (irritate) the lungs, including: Smoke from cigarettes and other types of tobacco. Dust and pollen. Fumes from chemicals, gases, or burned fuel. Indoor or outdoor air pollution. What increases the risk? A weak body's defense system. This is also called the immune system. Any condition that affects your lungs and breathing, such as asthma. What are the signs or symptoms? A cough. Coughing up clear, yellow, or green mucus. Making high-pitched whistling sounds when you breathe, most often when you breathe out (wheezing). Runny or stuffy nose. Having too much mucus in your lungs (chest congestion). Shortness of breath. Body aches. A sore throat. How is this treated? Acute bronchitis may go away over time without treatment. Your doctor may tell you to: Drink more fluids. This will help thin your mucus so it is easier to cough up. Use a device that gets medicine into your lungs (inhaler). Use a vaporizer or a humidifier. These are machines that add water to the air. This helps with coughing and poor breathing. Take a medicine that thins mucus and helps clear it from your lungs. Take a medicine that prevents or stops coughing. It is not common to take an antibiotic medicine for this condition. Follow these instructions at home:  Take over-the-counter and prescription medicines only as told by your doctor. Use an inhaler, vaporizer, or humidifier as told by your doctor. Take two teaspoons  (10 mL) of honey at bedtime. This helps lessen your coughing at night. Drink enough fluid to keep your pee (urine) pale yellow. Do not smoke or use any products that contain nicotine or tobacco. If you need help quitting, ask your doctor. Get a lot of rest. Return to your normal activities when your doctor says that it is safe. Keep all follow-up visits. How is this prevented?  Wash your hands often with soap and water for at least 20 seconds. If you cannot use soap and water, use hand sanitizer. Avoid contact with people who have cold symptoms. Try not to touch your mouth, nose, or eyes with your hands. Avoid breathing in smoke or chemical fumes. Make sure to get the flu shot every year. Contact a doctor if: Your symptoms do not get better in 2 weeks. You have trouble coughing up the mucus. Your cough keeps you awake at night. You have a fever. Get help right away if: You cough up blood. You have chest pain. You have very bad shortness of breath. You faint or keep feeling like you are going to faint. You have a very bad headache. Your fever or chills get worse. These symptoms may be an emergency. Get help right away. Call your local emergency services (911 in the [place] Do not wait to see if the symptoms will go away. Do not drive yourself to the hospital. Summary Acute bronchitis is when air tubes in the lungs (bronchi) suddenly get swollen. In adults, acute bronchitis usually goes away within 2 weeks. Drink more fluids. This will help thin your mucus so it is easier   to cough up. Take over-the-counter and prescription medicines only as told by your doctor. Contact a doctor if your symptoms do not improve after 2 weeks of treatment. This information is not intended to replace advice given to you by your health care provider. Make sure you discuss any questions you have with your health care provider. Document Revised: 10/16/2020 Document Reviewed: 10/16/2020 Elsevier Patient  Education  2024 [employer]  

## 2023-03-05 NOTE — Progress Notes (Signed)
BP 116/70   Pulse 66   Temp 98.1 F (36.7 C) (Oral)   Wt 222 lb 6.4 oz (100.9 kg)   SpO2 98%   BMI 31.02 kg/m    Subjective:    Patient ID: Matthew Mercado, male    DOB: 1960/11/13, 62 y.o.   MRN: [id number]  HPI: [name] is a 62 y.o. male  Chief Complaint  Patient presents with   URI    Pt states he has been having congestion, cough, brown phlegm from his chest, and some SOB when working. States he started feeling these symptoms 5 to 6 days ago. States his wife was just recently diagnosed with Bronchitis. States he has taken several at home covid tests that have all been negative.    UPPER RESPIRATORY TRACT INFECTION Since Sunday (6 days ago), wife has similar symptoms and obtained treatment at home.  Covid tests at home have been negative.  Main symptom is productive cough. Fever: no Cough: yes Shortness of breath:  occasional if vigorous activity Wheezing:  occasional at night time  Chest pain: no Chest tightness: no Chest congestion: no Nasal congestion: no Runny nose: no Post nasal drip: yes Sneezing: no Sore throat: a little from coughing Swollen glands: no Sinus pressure: no Headache: no Face pain: no Toothache: no Ear pain: none Ear pressure: none Eyes red/itching:yes Eye drainage/crusting: no  Vomiting: no Rash: no Fatigue: yes Sick contacts: yes -- wife Strep contacts: no  Context: fluctuating Recurrent sinusitis: no Relief with OTC cold/cough medications: no  Treatments attempted: cold/sinus and cough syrup    Relevant past medical, surgical, family and social history reviewed and updated as indicated. Interim medical history since our last visit reviewed. Allergies and medications reviewed and updated.  Review of Systems  Constitutional:  Positive for fatigue. Negative for activity change, appetite change, chills, diaphoresis and fever.  HENT:  Positive for postnasal drip and sore throat. Negative for congestion, ear discharge,  ear pain, rhinorrhea, sinus pressure, sinus pain, sneezing and voice change.   Respiratory:  Positive for cough, shortness of breath and wheezing. Negative for chest tightness.   Cardiovascular:  Negative for chest pain, palpitations and leg swelling.  Gastrointestinal: Negative.   Neurological: Negative.   Psychiatric/Behavioral: Negative.      Per HPI unless specifically indicated above     Objective:    BP 116/70   Pulse 66   Temp 98.1 F (36.7 C) (Oral)   Wt 222 lb 6.4 oz (100.9 kg)   SpO2 98%   BMI 31.02 kg/m   Wt Readings from Last 3 Encounters:  03/05/23 222 lb 6.4 oz (100.9 kg)  12/15/22 214 lb (97.1 kg)  10/27/22 221 lb 3.2 oz (100.3 kg)    Physical Exam Vitals and nursing note reviewed.  Constitutional:      General: He is awake. He is not in acute distress.    Appearance: He is well-developed and well-groomed. He is obese. He is not ill-appearing or toxic-appearing.  HENT:     Head: Normocephalic.     Right Ear: Hearing, ear canal and external ear normal. No tenderness. A middle ear effusion is present. Tympanic membrane is not injected or perforated.     Left Ear: Hearing, ear canal and external ear normal. No tenderness. A middle ear effusion is present. Tympanic membrane is not injected or perforated.     Nose: Rhinorrhea present. Rhinorrhea is clear.     Right Sinus: No maxillary sinus tenderness or frontal sinus  tenderness.     Left Sinus: No maxillary sinus tenderness or frontal sinus tenderness.     Mouth/Throat:     Mouth: Mucous membranes are moist.     Pharynx: Posterior oropharyngeal erythema (mild) present. No pharyngeal swelling or oropharyngeal exudate.  Eyes:     General: Lids are normal.     Extraocular Movements: Extraocular movements intact.     Conjunctiva/sclera: Conjunctivae normal.  Neck:     Vascular: No carotid bruit.  Cardiovascular:     Rate and Rhythm: Normal rate and regular rhythm.     Heart sounds: Normal heart sounds.   Pulmonary:     Effort: No accessory muscle usage or respiratory distress.     Breath sounds: Wheezing present. No decreased breath sounds or rhonchi.     Comments: Occasional fine expiratory wheezes noted. Abdominal:     General: Bowel sounds are normal. There is no distension.     Palpations: Abdomen is soft.     Tenderness: There is no abdominal tenderness.  Musculoskeletal:     Cervical back: Full passive range of motion without pain.     Right lower leg: No edema.     Left lower leg: No edema.  Lymphadenopathy:     Cervical: No cervical adenopathy.  Skin:    General: Skin is warm.     Capillary Refill: Capillary refill takes less than 2 seconds.  Neurological:     Mental Status: He is alert and oriented to person, place, and time.     Deep Tendon Reflexes: Reflexes are normal and symmetric.     Reflex Scores:      Brachioradialis reflexes are 2+ on the right side and 2+ on the left side.      Patellar reflexes are 2+ on the right side and 2+ on the left side. Psychiatric:        Attention and Perception: Attention normal.        Mood and Affect: Mood normal.        Speech: Speech normal.        Behavior: Behavior normal. Behavior is cooperative.        Thought Content: Thought content normal.    Results for orders placed or performed in visit on 09/28/22  AFP tumor marker  Result Value Ref Range   AFP, Serum, Tumor Marker 4.7 0.0 - 8.4 ng/mL      Assessment & Plan:   Problem List Items Addressed This Visit       Other   Cough - Primary    Present for 6-7 days, wife was ill before him.  Covid negative at home.  At this time since symptoms ongoing will start Zpack and Prednisone 40 MG daily for 5 days.  Recommend: - Increased rest - Increasing Fluids - Acetaminophen as needed for fever/pain.  - OTC Coricidin - Mucinex.  - Humidifying the air.         Follow up plan: Return for as scheduled October 30th.

## 2023-03-05 NOTE — Assessment & Plan Note (Signed)
Present for 6-7 days, wife was ill before him.  Covid negative at home.  At this time since symptoms ongoing will start Zpack and Prednisone 40 MG daily for 5 days.  Recommend: - Increased rest - Increasing Fluids - Acetaminophen as needed for fever/pain.  - OTC Coricidin - Mucinex.  - Humidifying the air.

## 2023-04-19 ENCOUNTER — Other Ambulatory Visit: Payer: Commercial Managed Care - PPO

## 2023-04-21 ENCOUNTER — Other Ambulatory Visit: Payer: Self-pay | Admitting: Nurse Practitioner

## 2023-04-21 DIAGNOSIS — R7301 Impaired fasting glucose: Secondary | ICD-10-CM

## 2023-04-21 DIAGNOSIS — N400 Enlarged prostate without lower urinary tract symptoms: Secondary | ICD-10-CM

## 2023-04-21 DIAGNOSIS — R7989 Other specified abnormal findings of blood chemistry: Secondary | ICD-10-CM

## 2023-04-21 DIAGNOSIS — E781 Pure hyperglyceridemia: Secondary | ICD-10-CM

## 2023-04-21 DIAGNOSIS — K7460 Unspecified cirrhosis of liver: Secondary | ICD-10-CM

## 2023-04-21 DIAGNOSIS — K739 Chronic hepatitis, unspecified: Secondary | ICD-10-CM

## 2023-04-22 ENCOUNTER — Other Ambulatory Visit
Admission: RE | Admit: 2023-04-22 | Discharge: 2023-04-22 | Disposition: A | Discharge status: Home / Self Care | Payer: Commercial Managed Care - PPO | Attending: Nurse Practitioner | Admitting: Nurse Practitioner

## 2023-04-22 DIAGNOSIS — E781 Pure hyperglyceridemia: Secondary | ICD-10-CM | POA: Diagnosis not present

## 2023-04-22 DIAGNOSIS — R7301 Impaired fasting glucose: Secondary | ICD-10-CM | POA: Diagnosis not present

## 2023-04-22 DIAGNOSIS — N400 Enlarged prostate without lower urinary tract symptoms: Secondary | ICD-10-CM

## 2023-04-22 DIAGNOSIS — R7989 Other specified abnormal findings of blood chemistry: Secondary | ICD-10-CM | POA: Diagnosis not present

## 2023-04-22 DIAGNOSIS — K7460 Unspecified cirrhosis of liver: Secondary | ICD-10-CM

## 2023-04-22 DIAGNOSIS — K739 Chronic hepatitis, unspecified: Secondary | ICD-10-CM

## 2023-04-22 LAB — COMPREHENSIVE METABOLIC PANEL
ALT: 19 U/L (ref 0–44)
AST: 26 U/L (ref 15–41)
Albumin: 4.4 g/dL (ref 3.5–5.0)
Alkaline Phosphatase: 58 U/L (ref 38–126)
Anion gap: 8 (ref 5–15)
BUN: 18 mg/dL (ref 8–23)
CO2: 29 mmol/L (ref 22–32)
Calcium: 8.7 mg/dL — ABNORMAL LOW (ref 8.9–10.3)
Chloride: 100 mmol/L (ref 98–111)
Creatinine, Ser: 1 mg/dL (ref 0.61–1.24)
GFR, Estimated: >60 mL/min (ref >60)
Glucose, Bld: 112 mg/dL — ABNORMAL HIGH (ref 70–99)
Potassium: 3.6 mmol/L (ref 3.5–5.1)
Sodium: 137 mmol/L (ref 135–145)
Total Bilirubin: 1.1 mg/dL (ref 0.3–1.2)
Total Protein: 7.4 g/dL (ref 6.5–8.1)

## 2023-04-22 LAB — CBC WITH DIFFERENTIAL/PLATELET
Abs Immature Granulocytes: 0.03 10*3/uL (ref 0.00–0.07)
Basophils Absolute: 0.1 10*3/uL (ref 0.0–0.1)
Basophils Relative: 1 %
Eosinophils Absolute: 0.6 10*3/uL — ABNORMAL HIGH (ref 0.0–0.5)
Eosinophils Relative: 8 %
HCT: 48.1 % (ref 39.0–52.0)
Hemoglobin: 16.5 g/dL (ref 13.0–17.0)
Immature Granulocytes: 0 %
Lymphocytes Relative: 21 %
Lymphs Abs: 1.6 10*3/uL (ref 0.7–4.0)
MCH: 31.5 pg (ref 26.0–34.0)
MCHC: 34.3 g/dL (ref 30.0–36.0)
MCV: 92 fL (ref 80.0–100.0)
Monocytes Absolute: 0.7 10*3/uL (ref 0.1–1.0)
Monocytes Relative: 9 %
Neutro Abs: 4.5 10*3/uL (ref 1.7–7.7)
Neutrophils Relative %: 61 %
Platelets: 204 10*3/uL (ref 150–400)
RBC: 5.23 MIL/uL (ref 4.22–5.81)
RDW: 12.5 % (ref 11.5–15.5)
WBC: 7.4 10*3/uL (ref 4.0–10.5)
nRBC: 0 % (ref 0.0–0.2)

## 2023-04-22 LAB — LIPID PANEL
Cholesterol: 132 mg/dL (ref 0–200)
HDL: 49 mg/dL (ref >40)
LDL Cholesterol: 70 mg/dL (ref 0–99)
Total CHOL/HDL Ratio: 2.7 {ratio}
Triglycerides: 67 mg/dL (ref <150)
VLDL: 13 mg/dL (ref 0–40)

## 2023-04-22 LAB — HEMOGLOBIN A1C
Hgb A1c MFr Bld: 4.9 % (ref 4.8–5.6)
Mean Plasma Glucose: 93.93 mg/dL

## 2023-04-22 LAB — TSH: TSH: 1.461 u[IU]/mL (ref 0.350–4.500)

## 2023-04-22 LAB — PSA: Prostatic Specific Antigen: 1.26 ng/mL (ref 0.00–4.00)

## 2023-04-22 NOTE — Addendum Note (Signed)
Addended by: Lonell Face C on: 04/22/2023 08:21 AM   Modules accepted: Orders

## 2023-04-22 NOTE — Addendum Note (Signed)
Addended by: Lonell Face C on: 04/22/2023 08:22 AM   Modules accepted: Orders

## 2023-04-22 NOTE — Progress Notes (Signed)
Prostate lab in normal range and A1c no prediabetes or diabetes:)

## 2023-04-22 NOTE — Progress Notes (Signed)
Contacted via MyChart   Good morning Matthew Mercado, sorry for the confusion on your labs.  All of use remaining providers in office get labs the day of visit, so I was not aware you were coming in early.  I am glad we were able to get them though.  Current labs look good with exception of mild elevation in sugar (glucose) and slightly low Calcium (add a little more calcium rich food to diet).  We are waiting on A1c and PSA testing to return:)  Keep being stellar!!  Thank you for allowing me to participate in your care.  I appreciate you. Kindest regards, Kanijah Groseclose

## 2023-04-25 NOTE — Patient Instructions (Signed)
Prediabetes Eating Plan Prediabetes is a condition that causes blood sugar (glucose) levels to be higher than normal. This increases the risk for developing type 2 diabetes (type 2 diabetes mellitus). Working with a health care provider or nutrition specialist (dietitian) to make diet and lifestyle changes can help prevent the onset of diabetes. These changes may help you: Control your blood glucose levels. Improve your cholesterol levels. Manage your blood pressure. What are tips for following this plan? Reading food labels Read food labels to check the amount of fat, salt (sodium), and sugar in prepackaged foods. Avoid foods that have: Saturated fats. Trans fats. Added sugars. Avoid foods that have more than 300 milligrams (mg) of sodium per serving. Limit your sodium intake to less than 2,300 mg each day. Shopping Avoid buying pre-made and processed foods. Avoid buying drinks with added sugar. Cooking Cook with olive oil. Do not use butter, lard, or ghee. Bake, broil, grill, steam, or boil foods. Avoid frying. Meal planning  Work with your dietitian to create an eating plan that is right for you. This may include tracking how many calories you take in each day. Use a food diary, notebook, or mobile application to track what you eat at each meal. Consider following a Mediterranean diet. This includes: Eating several servings of fresh fruits and vegetables each day. Eating fish at least twice a week. Eating one serving each day of whole grains, beans, nuts, and seeds. Using olive oil instead of other fats. Limiting alcohol. Limiting red meat. Using nonfat or low-fat dairy products. Consider following a plant-based diet. This includes dietary choices that focus on eating mostly vegetables and fruit, grains, beans, nuts, and seeds. If you have high blood pressure, you may need to limit your sodium intake or follow a diet such as the DASH (Dietary Approaches to Stop Hypertension) eating  plan. The DASH diet aims to lower high blood pressure. Lifestyle Set weight loss goals with help from your health care team. It is recommended that most people with prediabetes lose 7% of their body weight. Exercise for at least 30 minutes 5 or more days a week. Attend a support group or seek support from a mental health counselor. Take over-the-counter and prescription medicines only as told by your health care provider. What foods are recommended? Fruits Berries. Bananas. Apples. Oranges. Grapes. Papaya. Mango. Pomegranate. Kiwi. Grapefruit. Cherries. Vegetables Lettuce. Spinach. Peas. Beets. Cauliflower. Cabbage. Broccoli. Carrots. Tomatoes. Squash. Eggplant. Herbs. Peppers. Onions. Cucumbers. Brussels sprouts. Grains Whole grains, such as whole-wheat or whole-grain breads, crackers, cereals, and pasta. Unsweetened oatmeal. Bulgur. Barley. Quinoa. Brown rice. Corn or whole-wheat flour tortillas or taco shells. Meats and other proteins Seafood. Poultry without skin. Lean cuts of pork and beef. Tofu. Eggs. Nuts. Beans. Dairy Low-fat or fat-free dairy products, such as yogurt, cottage cheese, and cheese. Beverages Water. Tea. Coffee. Sugar-free or diet soda. Seltzer water. Low-fat or nonfat milk. Milk alternatives, such as soy or almond milk. Fats and oils Olive oil. Canola oil. Sunflower oil. Grapeseed oil. Avocado. Walnuts. Sweets and desserts Sugar-free or low-fat pudding. Sugar-free or low-fat ice cream and other frozen treats. Seasonings and condiments Herbs. Sodium-free spices. Mustard. Relish. Low-salt, low-sugar ketchup. Low-salt, low-sugar barbecue sauce. Low-fat or fat-free mayonnaise. The items listed above may not be a complete list of recommended foods and beverages. Contact a dietitian for more information. What foods are not recommended? Fruits Fruits canned with syrup. Vegetables Canned vegetables. Frozen vegetables with butter or cream sauce. Grains Refined white  flour and flour   products, such as bread, pasta, snack foods, and cereals. Meats and other proteins Fatty cuts of meat. Poultry with skin. Breaded or fried meat. Processed meats. Dairy Full-fat yogurt, cheese, or milk. Beverages Sweetened drinks, such as iced tea and soda. Fats and oils Butter. Lard. Ghee. Sweets and desserts Baked goods, such as cake, cupcakes, pastries, cookies, and cheesecake. Seasonings and condiments Spice mixes with added salt. Ketchup. Barbecue sauce. Mayonnaise. The items listed above may not be a complete list of foods and beverages that are not recommended. Contact a dietitian for more information. Where to find more information American Diabetes Association: www.diabetes.org Summary You may need to make diet and lifestyle changes to help prevent the onset of diabetes. These changes can help you control blood sugar, improve cholesterol levels, and manage blood pressure. Set weight loss goals with help from your health care team. It is recommended that most people with prediabetes lose 7% of their body weight. Consider following a Mediterranean diet. This includes eating plenty of fresh fruits and vegetables, whole grains, beans, nuts, seeds, fish, and low-fat dairy, and using olive oil instead of other fats. This information is not intended to replace advice given to you by your health care provider. Make sure you discuss any questions you have with your health care provider. Document Revised: 09/14/2019 Document Reviewed: 09/14/2019 Elsevier Patient Education  2024 [employer]  

## 2023-04-28 ENCOUNTER — Ambulatory Visit (INDEPENDENT_AMBULATORY_CARE_PROVIDER_SITE_OTHER): Payer: Commercial Managed Care - PPO | Admitting: Nurse Practitioner

## 2023-04-28 ENCOUNTER — Encounter: Payer: Self-pay | Admitting: Nurse Practitioner

## 2023-04-28 VITALS — BP 128/74 | HR 86 | Temp 97.9°F | Ht 71.0 in | Wt 220.4 lb

## 2023-04-28 DIAGNOSIS — K7460 Unspecified cirrhosis of liver: Secondary | ICD-10-CM

## 2023-04-28 DIAGNOSIS — F1011 Alcohol abuse, in remission: Secondary | ICD-10-CM

## 2023-04-28 DIAGNOSIS — E781 Pure hyperglyceridemia: Secondary | ICD-10-CM | POA: Diagnosis not present

## 2023-04-28 DIAGNOSIS — K739 Chronic hepatitis, unspecified: Secondary | ICD-10-CM | POA: Diagnosis not present

## 2023-04-28 DIAGNOSIS — R7301 Impaired fasting glucose: Secondary | ICD-10-CM

## 2023-04-28 DIAGNOSIS — Z800 Family history of malignant neoplasm of digestive organs: Secondary | ICD-10-CM | POA: Insufficient documentation

## 2023-04-28 DIAGNOSIS — R7989 Other specified abnormal findings of blood chemistry: Secondary | ICD-10-CM | POA: Diagnosis not present

## 2023-04-28 DIAGNOSIS — Z0000 Encounter for general adult medical examination without abnormal findings: Secondary | ICD-10-CM | POA: Diagnosis not present

## 2023-04-28 DIAGNOSIS — Z23 Encounter for immunization: Secondary | ICD-10-CM | POA: Diagnosis not present

## 2023-04-28 NOTE — Assessment & Plan Note (Signed)
Noted on past labs, no current medications.  Recommend heavy focus on diet changes, we discussed these changes today.  If ongoing elevations could consider medication.  LDL is 70 range.

## 2023-04-28 NOTE — Assessment & Plan Note (Signed)
Ongoing elevation glucose on labs, but A1c is well below prediabetes or diabetes level.  Focus on diet changes and regular exercise.

## 2023-04-28 NOTE — Assessment & Plan Note (Signed)
Chronic, ongoing.  Continue collaboration with GI.  Recent notes reviewed.  Recent labs were reassuring.

## 2023-04-28 NOTE — Assessment & Plan Note (Addendum)
Chronic, ongoing.  Followed by GI.  Will continue this collaboration.  Recent notes and labs reviewed.  Labs check in office was reassuring.

## 2023-04-28 NOTE — Assessment & Plan Note (Signed)
With underlying Hep C and Cirrhosis, continue collaboration with GI.  Labs up to date and reassuring.

## 2023-04-28 NOTE — Progress Notes (Signed)
BP 128/74 (BP Location: Left Arm, Patient Position: Sitting)   Pulse 86   Temp 97.9 F (36.6 C) (Oral)   Ht 5\' 11"  (1.803 m)   Wt 220 lb 6.4 oz (100 kg)   SpO2 96%   BMI 30.74 kg/m    Subjective:    Patient ID: Matthew Mercado, male    DOB: 1960/12/19, 62 y.o.   MRN: [id number]  HPI: Matthew Mercado is a 62 y.o. male presenting on 04/28/2023 for comprehensive medical examination. Current medical complaints include:none  He currently lives with: Interim Problems from his last visit: no  Labs prior to visit, reviewed with patient.  The 10-year ASCVD risk score (Arnett DK, et al., 2019) is: 6.6%   Values used to calculate the score:     Age: 13 years     Sex: Male     Is Non-Hispanic African American: No     Diabetic: No     Tobacco smoker: No     Systolic Blood Pressure: 128 mmHg     Is BP treated: No     HDL Cholesterol: 49 mg/dL     Total Cholesterol: 132 mg/dL  CIRRHOSIS & CHRONIC HEPATITIS Followed with Dr. Servando Snare at GI, last visit 09/28/22 and colonoscopy 12/15/22.  Continues on Lasix daily and Carvedilol.  On the 22nd of October 2024 was two years alcohol free.  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     Recent A1c was stable without prediabetes.   Duration:months Treatments attempted: as above Fever: no Nausea: no Vomiting: no Weight loss: no Decreased appetite: no Diarrhea: no Constipation: no Blood in stool: no Heartburn: no Jaundice: no Rash: no  Functional Status Survey: Is the patient deaf or have difficulty hearing?: No Does the patient have difficulty seeing, even when wearing glasses/contacts?: No Does the patient have difficulty concentrating, remembering, or making decisions?: No Does the patient have difficulty walking or climbing stairs?: No Does the patient have difficulty dressing or bathing?: No Does the patient have difficulty doing  errands alone such as visiting a doctor's office or shopping?: No  FALL RISK:    10/27/2022    2:18 PM 06/18/2022    1:55 PM 09/09/2021   10:08 AM 05/19/2021    2:35 PM 04/02/2021    3:55 PM  Fall Risk   Falls in the past year? 0 0 0 0 0  Number falls in past yr: 0 0 0 0 0  Injury with Fall? 0 0 0 0 0  Risk for fall due to : No Fall Risks No Fall Risks No Fall Risks No Fall Risks No Fall Risks  Follow up Falls evaluation completed Falls evaluation completed Falls evaluation completed Falls evaluation completed Falls evaluation completed   Depression Screen    04/28/2023    1:07 PM 10/27/2022    2:08 PM 06/18/2022    1:55 PM 04/28/2022    2:36 PM 09/09/2021   10:08 AM  Depression screen PHQ 2/9  Decreased Interest 0 0 0 0 0  Down, Depressed, Hopeless 0 0 0 0 0  PHQ - 2 Score 0 0 0 0 0  Altered sleeping 0 0 0 0 1  Tired, decreased energy 0 0 0 0 1  Change in appetite 0 0 0 1 1  Feeling bad or failure about yourself  0 0 0 0 0  Trouble concentrating 0 0 0 0  0  Moving slowly or fidgety/restless 0 0 0 0 0  Suicidal thoughts 0 0 0 0 0  PHQ-9 Score 0 0 0 1 3  Difficult doing work/chores   Not difficult at all Not difficult at all       04/28/2023    1:07 PM 10/27/2022    2:08 PM 06/18/2022    1:55 PM 04/28/2022    2:36 PM  GAD 7 : Generalized Anxiety Score  Nervous, Anxious, on Edge 0 0 0 0  Control/stop worrying 0 0 0 0  Worry too much - different things 0 0 0 0  Trouble relaxing 0 0 0 0  Restless 0 0 0 0  Easily annoyed or irritable 0 0 0 0  Afraid - awful might happen 0 0 0 0  Total GAD 7 Score 0 0 0 0  Anxiety Difficulty  Not difficult at all Not difficult at all Not difficult at all   Past Medical History:  Past Medical History:  Diagnosis Date   Cervical spine degeneration    Cirrhosis of liver without ascites (HCC) 04/15/2021   Depression    ETOH abuse 04/02/2021   Hepatitis C     Surgical History:  Past Surgical History:  Procedure Laterality Date    CHOLECYSTECTOMY     COLONOSCOPY WITH PROPOFOL N/A 12/08/2017   Procedure: COLONOSCOPY WITH PROPOFOL;  Surgeon: Midge Minium, MD;  Location: [facility];  Service: Endoscopy;  Laterality: N/A;   COLONOSCOPY WITH PROPOFOL N/A 12/15/2022   Procedure: COLONOSCOPY WITH PROPOFOL;  Surgeon: Midge Minium, MD;  Location: [facility] END[facility]COPY;  Service: Endoscopy;  Laterality: N/A;   ESOPHAGOGASTRODUODENOSCOPY (EGD) WITH PROPOFOL N/A 05/26/2021   Procedure: ESOPHAGOGASTRODUODENOSCOPY (EGD) WITH PROPOFOL;  Surgeon: Midge Minium, MD;  Location: [facility];  Service: Endoscopy;  Laterality: N/A;   POLYPECTOMY  12/15/2022   Procedure: POLYPECTOMY;  Surgeon: Midge Minium, MD;  Location: [facility] ENDOSCOPY;  Service: Endoscopy;;   SPINE SURGERY     discectomy and fusion C 4/5   TONSILLECTOMY      Medications:  Current Outpatient Medications on File Prior to Visit  Medication Sig   carvedilol (COREG) 3.125 MG tablet Take 2 tablets (6.25 mg total) by mouth 2 (two) times daily.   cetirizine (ZYRTEC) 10 MG tablet Take 10 mg by mouth daily.   furosemide (LASIX) 40 MG tablet Take 1 tablet (40 mg total) by mouth daily.   Multiple Vitamins-Minerals (MENS MULTIVITAMIN PO) Take by mouth.   RIBOFLAVIN PO Take by mouth.   No current facility-administered medications on file prior to visit.   Allergies:  No Known Allergies  Social History:  Social History   Socioeconomic History   Marital status: Married    Spouse name: Not on file   Number of children: Not on file   Years of education: Not on file   Highest education level: Not on file  Occupational History   Not on file  Tobacco Use   Smoking status: Former    Current packs/day: 0.00    Types: Cigarettes    Quit date: 11/12/2010    Years since quitting: 12.4   Smokeless tobacco: Never  Vaping Use   Vaping status: Never Used  Substance and Sexual Activity   Alcohol use: Not Currently   Drug use: No    Comment: Used in the past   Sexual  activity: Yes  Other Topics Concern   Not on file  Social History Narrative   Not on file   Social  Determinants of Health   Financial Resource Strain: Low Risk  (04/21/2021)   Overall Financial Resource Strain (CARDIA)    Difficulty of Paying Living Expenses: Not hard at all  Food Insecurity: No Food Insecurity (04/21/2021)   Hunger Vital Sign    Worried About Running Out of Food in the Last Year: Never true    Ran Out of Food in the Last Year: Never true  Transportation Needs: No Transportation Needs (04/21/2021)   PRAPARE - Administrator, Civil Service (Medical): No    Lack of Transportation (Non-Medical): No  Physical Activity: Not on file  Stress: No Stress Concern Present (04/21/2021)   [employer] of Occupational Health - Occupational Stress Questionnaire    Feeling of Stress : Not at all  Social Connections: Not on file  Intimate Partner Violence: Not on file   Social History   Tobacco Use  Smoking Status Former   Current packs/day: 0.00   Types: Cigarettes   Quit date: 11/12/2010   Years since quitting: 12.4  Smokeless Tobacco Never   Social History   Substance and Sexual Activity  Alcohol Use Not Currently    Family History:  Family History  Problem Relation Age of Onset   Cancer Mother    Cancer Father        prostate   Hypertension Father    Sleep apnea Sister    Hypertension Sister    Drug abuse Sister     Past medical history, surgical history, medications, allergies, family history and social history reviewed with patient today and changes made to appropriate areas of the chart.   ROS All other ROS negative except what is listed above and in the HPI.      Objective:    BP 128/74 (BP Location: Left Arm, Patient Position: Sitting)   Pulse 86   Temp 97.9 F (36.6 C) (Oral)   Ht 5\' 11"  (1.803 m)   Wt 220 lb 6.4 oz (100 kg)   SpO2 96%   BMI 30.74 kg/m   Wt Readings from Last 3 Encounters:  04/28/23 220 lb 6.4 oz (100  kg)  03/05/23 222 lb 6.4 oz (100.9 kg)  12/15/22 214 lb (97.1 kg)    Physical Exam Vitals and nursing note reviewed.  Constitutional:      General: He is awake. He is not in acute distress.    Appearance: He is well-developed and well-groomed. He is obese. He is not ill-appearing or toxic-appearing.  HENT:     Head: Normocephalic and atraumatic.     Right Ear: Hearing, tympanic membrane, ear canal and external ear normal. No drainage.     Left Ear: Hearing, tympanic membrane, ear canal and external ear normal. No drainage.     Nose: Nose normal.     Mouth/Throat:     Pharynx: Uvula midline.  Eyes:     General: Lids are normal.        Right eye: No discharge.        Left eye: No discharge.     Extraocular Movements: Extraocular movements intact.     Conjunctiva/sclera: Conjunctivae normal.     Pupils: Pupils are equal, round, and reactive to light.     Visual Fields: Right eye visual fields normal and left eye visual fields normal.  Neck:     Thyroid: No thyromegaly.     Vascular: No carotid bruit or JVD.     Trachea: Trachea normal.  Cardiovascular:     Rate and  Rhythm: Normal rate and regular rhythm.     Heart sounds: Normal heart sounds, S1 normal and S2 normal. No murmur heard.    No gallop.  Pulmonary:     Effort: Pulmonary effort is normal. No accessory muscle usage or respiratory distress.     Breath sounds: Normal breath sounds.  Abdominal:     General: Bowel sounds are normal.     Palpations: Abdomen is soft. There is no hepatomegaly or splenomegaly.     Tenderness: There is no abdominal tenderness.  Musculoskeletal:        General: Normal range of motion.     Cervical back: Normal range of motion and neck supple.     Right lower leg: No edema.     Left lower leg: No edema.  Lymphadenopathy:     Head:     Right side of head: No submental, submandibular, tonsillar, preauricular or posterior auricular adenopathy.     Left side of head: No submental, submandibular,  tonsillar, preauricular or posterior auricular adenopathy.     Cervical: No cervical adenopathy.  Skin:    General: Skin is warm and dry.     Capillary Refill: Capillary refill takes less than 2 seconds.     Findings: No rash.  Neurological:     Mental Status: He is alert and oriented to person, place, and time.     Gait: Gait is intact.     Deep Tendon Reflexes: Reflexes are normal and symmetric.     Reflex Scores:      Brachioradialis reflexes are 2+ on the right side and 2+ on the left side.      Patellar reflexes are 2+ on the right side and 2+ on the left side. Psychiatric:        Attention and Perception: Attention normal.        Mood and Affect: Mood normal.        Speech: Speech normal.        Behavior: Behavior normal. Behavior is cooperative.        Thought Content: Thought content normal.        Cognition and Memory: Cognition normal.     Results for orders placed or performed during the hospital encounter of 04/22/23  Lipid panel  Result Value Ref Range   Cholesterol 132 0 - 200 mg/dL   Triglycerides 67 <960 mg/dL   HDL 49 >45 mg/dL   Total CHOL/HDL Ratio 2.7 RATIO   VLDL 13 0 - 40 mg/dL   LDL Cholesterol 70 0 - 99 mg/dL  CBC with Differential/Platelet  Result Value Ref Range   WBC 7.4 4.0 - 10.5 K/uL   RBC 5.23 4.22 - 5.81 MIL/uL   Hemoglobin 16.5 13.0 - 17.0 g/dL   HCT 40.9 81.1 - 91.4 %   MCV 92.0 80.0 - 100.0 fL   MCH 31.5 26.0 - 34.0 pg   MCHC 34.3 30.0 - 36.0 g/dL   RDW 78.2 95.6 - 21.3 %   Platelets 204 150 - 400 K/uL   nRBC 0.0 0.0 - 0.2 %   Neutrophils Relative % 61 %   Neutro Abs 4.5 1.7 - 7.7 K/uL   Lymphocytes Relative 21 %   Lymphs Abs 1.6 0.7 - 4.0 K/uL   Monocytes Relative 9 %   Monocytes Absolute 0.7 0.1 - 1.0 K/uL   Eosinophils Relative 8 %   Eosinophils Absolute 0.6 (H) 0.0 - 0.5 K/uL   Basophils Relative 1 %   Basophils Absolute 0.1  0.0 - 0.1 K/uL   Immature Granulocytes 0 %   Abs Immature Granulocytes 0.03 0.00 - 0.07 K/uL   Comprehensive metabolic panel  Result Value Ref Range   Sodium 137 135 - 145 mmol/L   Potassium 3.6 3.5 - 5.1 mmol/L   Chloride 100 98 - 111 mmol/L   CO2 29 22 - 32 mmol/L   Glucose, Bld 112 (H) 70 - 99 mg/dL   BUN 18 8 - 23 mg/dL   Creatinine, Ser 0.10 0.61 - 1.24 mg/dL   Calcium 8.7 (L) 8.9 - 10.3 mg/dL   Total Protein 7.4 6.5 - 8.1 g/dL   Albumin 4.4 3.5 - 5.0 g/dL   AST 26 15 - 41 U/L   ALT 19 0 - 44 U/L   Alkaline Phosphatase 58 38 - 126 U/L   Total Bilirubin 1.1 0.3 - 1.2 mg/dL   GFR, Estimated >27 >25 mL/min   Anion gap 8 5 - 15  TSH  Result Value Ref Range   TSH 1.461 0.350 - 4.500 uIU/mL  PSA  Result Value Ref Range   Prostatic Specific Antigen 1.26 0.00 - 4.00 ng/mL  HgB A1c  Result Value Ref Range   Hgb A1c MFr Bld 4.9 4.8 - 5.6 %   Mean Plasma Glucose 93.93 mg/dL      Assessment & Plan:   Problem List Items Addressed This Visit       Digestive   Chronic hepatitis (HCC)    Chronic, ongoing.  Followed by GI.  Will continue this collaboration.  Recent notes and labs reviewed.  Labs check in office was reassuring.      Cirrhosis of liver without ascites (HCC) - Primary    Chronic, ongoing.  Continue collaboration with GI.  Recent notes reviewed.  Recent labs were reassuring.        Endocrine   IFG (impaired fasting glucose)    Ongoing elevation glucose on labs, but A1c is well below prediabetes or diabetes level.  Focus on diet changes and regular exercise.        Other   Elevated LFTs    With underlying Hep C and Cirrhosis, continue collaboration with GI.  Labs up to date and reassuring.      History of alcohol abuse   Hypertriglyceridemia without hypercholesterolemia    Noted on past labs, no current medications.  Recommend heavy focus on diet changes, we discussed these changes today.  If ongoing elevations could consider medication.  LDL is 70 range.      Other Visit Diagnoses     Flu vaccine need       Flu vaccine today, educated  patient.   Relevant Orders   Flu vaccine trivalent PF, 6mos and older(Flulaval,Afluria,Fluarix,Fluzone) (Completed)   Encounter for annual physical exam       Annual exam today, went over health maintenance.       Discussed aspirin prophylaxis for myocardial infarction prevention and decision was   LABORATORY TESTING:  Health maintenance labs ordered today as discussed above.   The natural history of prostate cancer and ongoing controversy regarding screening and potential treatment outcomes of prostate cancer has been discussed with the patient. The meaning of a false positive PSA and a false negative PSA has been discussed. He indicates understanding of the limitations of this screening test and wishes to proceed with screening PSA testing.   IMMUNIZATIONS:   - Tdap: Tetanus vaccination status reviewed: last tetanus booster within 10 years. - Influenza: Administered today - Pneumovax:  Not applicable - Prevnar: Not applicable - Zostavax vaccine: Up to date  SCREENING: - Colonoscopy: Up to date  Discussed with patient purpose of the colonoscopy is to detect colon cancer at curable precancerous or early stages   - AAA Screening: Not applicable  -Hearing Test: Not applicable  -Spirometry: Not applicable   PATIENT COUNSELING:    Sexuality: Discussed sexually transmitted diseases, partner selection, use of condoms, avoidance of unintended pregnancy  and contraceptive alternatives.   Advised to avoid cigarette smoking.  I discussed with the patient that most people either abstain from alcohol or drink within safe limits (<=14/week and <=4 drinks/occasion for males, <=7/weeks and <= 3 drinks/occasion for females) and that the risk for alcohol disorders and other health effects rises proportionally with the number of drinks per week and how often a drinker exceeds daily limits.  Discussed cessation/primary prevention of drug use and availability of treatment for abuse.   Diet:  Encouraged to adjust caloric intake to maintain  or achieve ideal body weight, to reduce intake of dietary saturated fat and total fat, to limit sodium intake by avoiding high sodium foods and not adding table salt, and to maintain adequate dietary potassium and calcium preferably from fresh fruits, vegetables, and low-fat dairy products.    Stressed the importance of regular exercise  Injury prevention: Discussed safety belts, safety helmets, smoke detector, smoking near bedding or upholstery.   Dental health: Discussed importance of regular tooth brushing, flossing, and dental visits.   Follow up plan: NEXT PREVENTATIVE PHYSICAL DUE IN 1 YEAR. Return in about 6 months (around 10/27/2023) for CIRRHOSIS, IFG, HLD.

## 2023-06-02 DIAGNOSIS — H11002 Unspecified pterygium of left eye: Secondary | ICD-10-CM | POA: Diagnosis not present

## 2023-06-02 DIAGNOSIS — H26492 Other secondary cataract, left eye: Secondary | ICD-10-CM | POA: Diagnosis not present

## 2023-06-02 DIAGNOSIS — H2511 Age-related nuclear cataract, right eye: Secondary | ICD-10-CM | POA: Diagnosis not present

## 2023-08-10 ENCOUNTER — Other Ambulatory Visit: Payer: Self-pay

## 2023-09-29 ENCOUNTER — Other Ambulatory Visit: Payer: Self-pay

## 2023-09-29 ENCOUNTER — Encounter: Payer: Self-pay | Admitting: Emergency Medicine

## 2023-09-29 ENCOUNTER — Ambulatory Visit
Admission: EM | Admit: 2023-09-29 | Discharge: 2023-09-29 | Disposition: A | Discharge status: Home / Self Care | Attending: Emergency Medicine | Admitting: Emergency Medicine

## 2023-09-29 DIAGNOSIS — L03031 Cellulitis of right toe: Secondary | ICD-10-CM | POA: Diagnosis not present

## 2023-09-29 MED ORDER — DOXYCYCLINE HYCLATE 100 MG PO CAPS
100.0000 mg | ORAL_CAPSULE | Freq: Two times a day (BID) | ORAL | 0 refills | Status: DC
  Filled 2023-09-29: qty 14, 7d supply, fill #0

## 2023-09-29 NOTE — ED Triage Notes (Signed)
 Patient presents to [facility] for evaluation of big toe pain on the right foot x 2 days.  Says he was messing with an ingrown toenail and now the area is swollen, red, and painful

## 2023-09-29 NOTE — ED Provider Notes (Signed)
 Matthew Mercado    CSN: [id number] Arrival date & time: 09/29/23  1303      History   Chief Complaint Chief Complaint  Patient presents with   Toe Pain         HPI Matthew Mercado is a 63 y.o. male.   Patient presents for evaluation of erythema, swelling, pain and drainage present to the right great toe beginning 2 days ago after attempting to remove ingrown nail.  Has not attempted treatment but does plan to complete Epsom salt soak.  Denies fever.  Denies injury .   Past Medical History:  Diagnosis Date   Cervical spine degeneration    Cirrhosis of liver without ascites (HCC) 04/15/2021   Depression    ETOH abuse 04/02/2021   Hepatitis C     Patient Active Problem List   Diagnosis Date Noted   Family history of colon cancer in mother 04/28/2023   History of colonic polyps 12/15/2022   Polyp of descending colon 12/15/2022   Hypertriglyceridemia without hypercholesterolemia 10/25/2022   IFG (impaired fasting glucose) 04/26/2022   Chronic hepatitis (HCC) 06/19/2021   Tinnitus aurium, bilateral 06/19/2021   DJD (degenerative joint disease) of cervical spine 06/19/2021   Gastric varices    Cirrhosis of liver without ascites (HCC) 04/15/2021   Elevated LFTs 04/02/2021   Hyperbilirubinemia 04/02/2021   History of alcohol abuse 04/02/2021   Benign neoplasm of descending colon    Polyp of sigmoid colon     Past Surgical History:  Procedure Laterality Date   CHOLECYSTECTOMY     COLONOSCOPY WITH PROPOFOL N/A 12/08/2017   Procedure: COLONOSCOPY WITH PROPOFOL;  Surgeon: Midge Minium, MD;  Location: [facility];  Service: Endoscopy;  Laterality: N/A;   COLONOSCOPY WITH PROPOFOL N/A 12/15/2022   Procedure: COLONOSCOPY WITH PROPOFOL;  Surgeon: Midge Minium, MD;  Location: [facility] ENDOSCOPY;  Service: Endoscopy;  Laterality: N/A;   ESOPHAGOGASTRODUODENOSCOPY (EGD) WITH PROPOFOL N/A 05/26/2021   Procedure: ESOPHAGOGASTRODUODENOSCOPY (EGD) WITH PROPOFOL;  Surgeon:  Midge Minium, MD;  Location: [facility];  Service: Endoscopy;  Laterality: N/A;   POLYPECTOMY  12/15/2022   Procedure: POLYPECTOMY;  Surgeon: Midge Minium, MD;  Location: [facility] ENDOSCOPY;  Service: Endoscopy;;   SPINE SURGERY     discectomy and fusion C 4/5   TONSILLECTOMY         Home Medications    Prior to Admission medications   Medication Sig Start Date End Date Taking? Authorizing Provider  doxycycline (VIBRAMYCIN) 100 MG capsule Take 1 capsule (100 mg total) by mouth 2 (two) times daily. 09/29/23  Yes Sheana Bir R, NP  carvedilol (COREG) 3.125 MG tablet Take 2 tablets (6.25 mg total) by mouth 2 (two) times daily. 10/08/22   Midge Minium, MD  cetirizine (ZYRTEC) 10 MG tablet Take 10 mg by mouth daily.    [provider]  furosemide (LASIX) 40 MG tablet Take 1 tablet (40 mg total) by mouth daily. 10/08/22   Midge Minium, MD  Multiple Vitamins-Minerals (MENS MULTIVITAMIN PO) Take by mouth.    [provider]  RIBOFLAVIN PO Take by mouth.    [provider]    Family History Family History  Problem Relation Age of Onset   Cancer Mother    Cancer Father        prostate   Hypertension Father    Sleep apnea Sister    Hypertension Sister    Drug abuse Sister     Social History Social History   Tobacco Use  Smoking status: Former    Current packs/day: 0.00    Types: Cigarettes    Quit date: 11/12/2010    Years since quitting: 12.8   Smokeless tobacco: Never  Vaping Use   Vaping status: Never Used  Substance Use Topics   Alcohol use: Not Currently   Drug use: No    Comment: Used in the past     Allergies   Patient has no known allergies.   Review of Systems Review of Systems   Physical Exam Triage Vital Signs ED Triage Vitals  Encounter Vitals Group     BP 09/29/23 1333 137/88     Systolic BP Percentile --      Diastolic BP Percentile --      Pulse Rate 09/29/23 1333 78     Resp 09/29/23 1333 18     Temp 09/29/23  1333 98.3 F (36.8 C)     Temp Source 09/29/23 1333 Oral     SpO2 09/29/23 1333 98 %     Weight --      Height --      Head Circumference --      Peak Flow --      Pain Score 09/29/23 1334 5     Pain Loc --      Pain Education --      Exclude from Growth Chart --    No data found.  Updated Vital Signs BP 137/88 (BP Location: Left Arm)   Pulse 78   Temp 98.3 F (36.8 C) (Oral)   Resp 18   SpO2 98%   Visual Acuity Right Eye Distance:   Left Eye Distance:   Bilateral Distance:    Right Eye Near:   Left Eye Near:    Bilateral Near:     Physical Exam Constitutional:      Appearance: Normal appearance.  Eyes:     Extraocular Movements: Extraocular movements intact.  Pulmonary:     Effort: Pulmonary effort is normal.  Skin:    Comments: Erythema, mild swelling, tenderness and dried red blood present to the medial aspect of the left great toenail bed, sensation intact, capillary refill less than 3, able to bear weight and complete range of motion, 2+ pedal pulse  Neurological:     Mental Status: He is alert and oriented to person, place, and time. Mental status is at baseline.      UC Treatments / Results  Labs (all labs ordered are listed, but only abnormal results are displayed) Labs Reviewed - No data to display  EKG   Radiology No results found.  Procedures Procedures (including critical care time)  Medications Ordered in UC Medications - No data to display  Initial Impression / Assessment and Plan / UC Course  I have reviewed the triage vital signs and the nursing notes.  Pertinent labs & imaging results that were available during my care of the patient were reviewed by me and considered in my medical decision making (see chart for details).  Paronychia of the right great toe  Presentation consistent with infection, discussed, prescribed doxycycline and recommended additional supportive care, advised follow-up with urgent care or podiatrist if  symptoms do not improve Final Clinical Impressions(s) / UC Diagnoses   Final diagnoses:  Paronychia of great toe, right     Discharge Instructions      Evaluated for your toenail which appears to be infected  Take doxycycline twice daily for 7 days to clear germs contributing to symptoms  May complete  warm soaks or warm compresses to the affected area for comfort and to help with drainage  May take Tylenol and or Motrin as needed for pain  May follow-up with his urgent care the podiatrist if symptoms do not improve   ED Prescriptions     Medication Sig Dispense Auth. Provider   doxycycline (VIBRAMYCIN) 100 MG capsule Take 1 capsule (100 mg total) by mouth 2 (two) times daily. 14 capsule Eugen Jeansonne, Elita Boone, NP      PDMP not reviewed this encounter.   Valinda Hoar, NP 09/29/23 1352

## 2023-09-29 NOTE — Discharge Instructions (Signed)
 Evaluated for your toenail which appears to be infected  Take doxycycline twice daily for 7 days to clear germs contributing to symptoms  May complete warm soaks or warm compresses to the affected area for comfort and to help with drainage  May take Tylenol and or Motrin as needed for pain  May follow-up with his urgent care the podiatrist if symptoms do not improve

## 2023-10-04 DIAGNOSIS — H11002 Unspecified pterygium of left eye: Secondary | ICD-10-CM | POA: Diagnosis not present

## 2023-10-12 ENCOUNTER — Encounter: Payer: Self-pay | Admitting: Ophthalmology

## 2023-10-13 NOTE — Anesthesia Preprocedure Evaluation (Addendum)
 Anesthesia Evaluation  Patient identified by MRN, date of birth, ID band Patient awake    Reviewed: Allergy & Precautions, H&P , NPO status , Patient's Chart, lab work & pertinent test results  Airway Mallampati: III  TM Distance: <3 FB Neck ROM: Full    Dental no notable dental hx. (+) Poor Dentition, Chipped Chipped right upper central incisor more notable:   Pulmonary former smoker   Pulmonary exam normal breath sounds clear to auscultation       Cardiovascular negative cardio ROS Normal cardiovascular exam Rhythm:Regular Rate:Normal     Neuro/Psych  PSYCHIATRIC DISORDERS  Depression    negative neurological ROS  negative psych ROS   GI/Hepatic negative GI ROS, Neg liver ROS,,,(+) Hepatitis -  Endo/Other  negative endocrine ROS    Renal/GU negative Renal ROS  negative genitourinary   Musculoskeletal negative musculoskeletal ROS (+)    Abdominal   Peds negative pediatric ROS (+)  Hematology negative hematology ROS (+)   Anesthesia Other Findings Colonoscopy 09-14-22 Dr. Dawson Europe  Cervical spine degeneration  Depression Hepatitis C  Cirrhosis of liver without ascites (HCC) ETOH abuse     Reproductive/Obstetrics negative OB ROS                             Anesthesia Physical Anesthesia Plan  ASA: 3  Anesthesia Plan: MAC   Post-op Pain Management:    Induction: Intravenous  PONV Risk Score and Plan:   Airway Management Planned: Natural Airway and Nasal Cannula  Additional Equipment:   Intra-op Plan:   Post-operative Plan:   Informed Consent: I have reviewed the patients History and Physical, chart, labs and discussed the procedure including the risks, benefits and alternatives for the proposed anesthesia with the patient or authorized representative who has indicated his/her understanding and acceptance.     Dental Advisory Given  Plan Discussed with:  Anesthesiologist, CRNA and Surgeon  Anesthesia Plan Comments: (Patient consented for risks of anesthesia including but not limited to:  - adverse reactions to medications - damage to eyes, teeth, lips or other oral mucosa - nerve damage due to positioning  - sore throat or hoarseness - Damage to heart, brain, nerves, lungs, other parts of body or loss of life  Patient voiced understanding and assent.)        Anesthesia Quick Evaluation

## 2023-10-14 ENCOUNTER — Other Ambulatory Visit: Payer: Self-pay | Admitting: Gastroenterology

## 2023-10-14 ENCOUNTER — Other Ambulatory Visit: Payer: Self-pay

## 2023-10-14 MED ORDER — CARVEDILOL 3.125 MG PO TABS
6.2500 mg | ORAL_TABLET | Freq: Two times a day (BID) | ORAL | 0 refills | Status: DC
  Filled 2023-10-14: qty 120, 30d supply, fill #0

## 2023-10-14 MED ORDER — FUROSEMIDE 40 MG PO TABS
40.0000 mg | ORAL_TABLET | Freq: Every day | ORAL | 0 refills | Status: DC
  Filled 2023-10-14: qty 90, 90d supply, fill #0

## 2023-10-15 ENCOUNTER — Other Ambulatory Visit: Payer: Self-pay

## 2023-10-18 NOTE — Discharge Instructions (Signed)

## 2023-10-19 ENCOUNTER — Encounter: Payer: Self-pay | Admitting: Ophthalmology

## 2023-10-19 ENCOUNTER — Ambulatory Visit: Payer: Self-pay | Admitting: Anesthesiology

## 2023-10-19 ENCOUNTER — Ambulatory Visit
Admission: RE | Admit: 2023-10-19 | Discharge: 2023-10-19 | Disposition: A | Discharge status: Home / Self Care | Attending: Ophthalmology | Admitting: Ophthalmology

## 2023-10-19 ENCOUNTER — Other Ambulatory Visit: Payer: Self-pay

## 2023-10-19 ENCOUNTER — Encounter
Admission: RE | Disposition: A | Discharge status: Home / Self Care | Payer: Self-pay | Source: Home / Self Care | Attending: Ophthalmology

## 2023-10-19 DIAGNOSIS — Z8619 Personal history of other infectious and parasitic diseases: Secondary | ICD-10-CM | POA: Diagnosis not present

## 2023-10-19 DIAGNOSIS — H11002 Unspecified pterygium of left eye: Secondary | ICD-10-CM | POA: Insufficient documentation

## 2023-10-19 DIAGNOSIS — Z87891 Personal history of nicotine dependence: Secondary | ICD-10-CM | POA: Insufficient documentation

## 2023-10-19 DIAGNOSIS — F32A Depression, unspecified: Secondary | ICD-10-CM | POA: Diagnosis not present

## 2023-10-19 HISTORY — PX: PTERYGIUM EXCISION: SHX2273

## 2023-10-19 SURGERY — EXCISION, PTERYGIUM
Anesthesia: Monitor Anesthesia Care | Site: Eye | Laterality: Left

## 2023-10-19 MED ORDER — PHENYLEPHRINE HCL 10 % OP SOLN
OPHTHALMIC | Status: AC
Start: 2023-10-19 — End: ?
  Filled 2023-10-19: qty 5

## 2023-10-19 MED ORDER — POVIDONE-IODINE 5 % OP SOLN
OPHTHALMIC | Status: DC | PRN
  Administered 2023-10-19: 1 via OPHTHALMIC

## 2023-10-19 MED ORDER — OXYCODONE-ACETAMINOPHEN 5-325 MG PO TABS
1.0000 | ORAL_TABLET | ORAL | 0 refills | Status: AC | PRN
  Filled 2023-10-19: qty 8, 2d supply, fill #0

## 2023-10-19 MED ORDER — PROPOFOL 10 MG/ML IV BOLUS
INTRAVENOUS | Status: DC | PRN
Start: 2023-10-19 — End: 2023-10-19
  Administered 2023-10-19: 50 mg via INTRAVENOUS

## 2023-10-19 MED ORDER — BSS IO SOLN
INTRAOCULAR | Status: DC | PRN
  Administered 2023-10-19: 15 mL via INTRAOCULAR

## 2023-10-19 MED ORDER — NEOMYCIN-POLYMYXIN-DEXAMETH 3.5-10000-0.1 OP SUSP
OPHTHALMIC | Status: DC | PRN
  Administered 2023-10-19: 1 [drp] via OPHTHALMIC

## 2023-10-19 MED ORDER — LIDOCAINE HCL (PF) 2 % IJ SOLN
INTRAMUSCULAR | Status: DC | PRN
  Administered 2023-10-19: 3 mL via INTRAMUSCULAR

## 2023-10-19 MED ORDER — LIDOCAINE HCL (PF) 2 % IJ SOLN
INTRAOCULAR | Status: DC | PRN
  Administered 2023-10-19: 1 mL via INTRAOCULAR

## 2023-10-19 MED ORDER — HYALURONIDASE HUMAN 150 UNIT/ML IJ SOLN
INTRAMUSCULAR | Status: AC
  Filled 2023-10-19: qty 1

## 2023-10-19 MED ORDER — LIDOCAINE HCL (CARDIAC) PF 100 MG/5ML IV SOSY
PREFILLED_SYRINGE | INTRAVENOUS | Status: DC | PRN
  Administered 2023-10-19: 60 mg via INTRATRACHEAL

## 2023-10-19 MED ORDER — TETRACAINE HCL 0.5 % OP SOLN
1.0000 [drp] | OPHTHALMIC | Status: AC
  Administered 2023-10-19 (×3): 1 [drp] via OPHTHALMIC

## 2023-10-19 MED ORDER — PROPOFOL 10 MG/ML IV BOLUS
INTRAVENOUS | Status: AC
  Filled 2023-10-19: qty 20

## 2023-10-19 MED ORDER — PHENYLEPHRINE HCL 10 % OP SOLN
1.0000 [drp] | Freq: Once | OPHTHALMIC | Status: AC
  Administered 2023-10-19: 1 [drp] via OPHTHALMIC

## 2023-10-19 MED ORDER — TETRACAINE HCL 0.5 % OP SOLN
OPHTHALMIC | Status: AC
  Filled 2023-10-19: qty 4

## 2023-10-19 SURGICAL SUPPLY — 22 items
4-0 Ethicon Perma-Hand Silk SH IMPLANT
AMNIOGRAFT 2.0X1.5 (Graft) IMPLANT
APPLICATOR COT TIP 3IN STRL (MISCELLANEOUS) ×1 IMPLANT
BLADE MINI RND TIP GREEN BEAV (BLADE) ×1 IMPLANT
BNDG EYE OVAL 2 1/8 X 2 5/8 (GAUZE/BANDAGES/DRESSINGS) ×2 IMPLANT
CORD BIP STRL DISP 12FT (MISCELLANEOUS) ×1 IMPLANT
COVER MAYO STAND STRL (DRAPES) IMPLANT
Corneal Light Shield 8mm ×1 IMPLANT
ERASER HMR WETFIELD 18G (MISCELLANEOUS) ×1 IMPLANT
GLOVE BIOGEL PI IND STRL 8 (GLOVE) ×1 IMPLANT
GLOVE SURG LX STRL 8.0 MICRO (GLOVE) ×1 IMPLANT
GLOVE SURG PROTEXIS BL SZ6.5 (GLOVE) ×1 IMPLANT
GLOVE SURG SYN 6.5 PF PI BL (GLOVE) ×1 IMPLANT
NDL FILTER BLUNT 18X1 1/2 (NEEDLE) ×1 IMPLANT
NDL RETROBULBAR 25GX1.5 STRL (NEEDLE) ×1 IMPLANT
NEEDLE FILTER BLUNT 18X1 1/2 (NEEDLE) ×1 IMPLANT
PTERYPIUM EXCISION (PACKS) ×1 IMPLANT
SEALANT FIBRIN TSSL HEMOSTAT 2 (Miscellaneous) IMPLANT
SOLUTION BAL SALT 15ML (MISCELLANEOUS) IMPLANT
SPONGE SURG I SPEAR (MISCELLANEOUS) IMPLANT
SYR 10ML LL (SYRINGE) ×1 IMPLANT
WATER STERILE IRR 500ML POUR (IV SOLUTION) ×1 IMPLANT

## 2023-10-19 NOTE — Anesthesia Postprocedure Evaluation (Signed)
 Anesthesia Post Note  Patient: Matthew Mercado  Procedure(s) Performed: EXCISION, PTERYGIUM (Left: Eye)  Patient location during evaluation: PACU Anesthesia Type: MAC Level of consciousness: awake and alert Pain management: pain level controlled Vital Signs Assessment: post-procedure vital signs reviewed and stable Respiratory status: spontaneous breathing, nonlabored ventilation, respiratory function stable and patient connected to nasal cannula oxygen Cardiovascular status: stable and blood pressure returned to baseline Postop Assessment: no apparent nausea or vomiting Anesthetic complications: no   No notable events documented.   Last Vitals:  Vitals:   10/19/23 0916 10/19/23 0921  BP: 132/67 123/89  Pulse: 60 60  Resp:  13  Temp: (!) 36.4 C (!) 36.4 C  SpO2: 98% 97%    Last Pain:  Vitals:   10/19/23 0921  TempSrc:   PainSc: 0-No pain                 Emilie Harden

## 2023-10-19 NOTE — H&P (Signed)
 [facility]   Primary Care Physician:  Lemar Pyles, NP Ophthalmologist: Dr. Jeb Miner  Pre-Procedure History & Physical: HPI:  Matthew Mercado is a 63 y.o. male here for cataract surgery.   Past Medical History:  Diagnosis Date   Cervical spine degeneration    Cirrhosis of liver without ascites (HCC) 04/15/2021   Depression    ETOH abuse 04/02/2021   Hepatitis C     Past Surgical History:  Procedure Laterality Date   CHOLECYSTECTOMY     COLONOSCOPY WITH PROPOFOL  N/A 12/08/2017   Procedure: COLONOSCOPY WITH PROPOFOL ;  Surgeon: Marnee Sink, MD;  Location: [facility];  Service: Endoscopy;  Laterality: N/A;   COLONOSCOPY WITH PROPOFOL  N/A 12/15/2022   Procedure: COLONOSCOPY WITH PROPOFOL ;  Surgeon: Marnee Sink, MD;  Location: [facility];  Service: Endoscopy;  Laterality: N/A;   ESOPHAGOGASTRODUODENOSCOPY (EGD) WITH PROPOFOL  N/A 05/26/2021   Procedure: ESOPHAGOGASTRODUODENOSCOPY (EGD) WITH PROPOFOL ;  Surgeon: Marnee Sink, MD;  Location: [facility];  Service: Endoscopy;  Laterality: N/A;   POLYPECTOMY  12/15/2022   Procedure: POLYPECTOMY;  Surgeon: Marnee Sink, MD;  Location: [facility];  Service: Endoscopy;;   SPINE SURGERY     discectomy and fusion C 4/5   TONSILLECTOMY      Prior to Admission medications   Medication Sig Start Date End Date Taking? Authorizing Provider  carvedilol  (COREG ) 3.125 MG tablet Take 2 tablets (6.25 mg total) by mouth 2 (two) times daily. 10/14/23  Yes Marnee Sink, MD  cetirizine (ZYRTEC) 10 MG tablet Take 10 mg by mouth daily.   Yes [provider]  furosemide  (LASIX ) 40 MG tablet Take 1 tablet (40 mg total) by mouth daily. 10/14/23  Yes Marnee Sink, MD  Multiple Vitamins-Minerals (MENS MULTIVITAMIN PO) Take by mouth.   Yes [provider]  RIBOFLAVIN PO Take by mouth.   Yes [provider]    Allergies as of 10/06/2023   (No Known Allergies)    Family History  Problem Relation  Age of Onset   Cancer Mother    Cancer Father        prostate   Hypertension Father    Sleep apnea Sister    Hypertension Sister    Drug abuse Sister     Social History   Socioeconomic History   Marital status: Married    Spouse name: Not on file   Number of children: Not on file   Years of education: Not on file   Highest education level: Not on file  Occupational History   Not on file  Tobacco Use   Smoking status: Former    Current packs/day: 0.00    Types: Cigarettes    Quit date: 11/12/2010    Years since quitting: 12.9   Smokeless tobacco: Never  Vaping Use   Vaping status: Never Used  Substance and Sexual Activity   Alcohol use: Not Currently    Comment: Quit approx 2022   Drug use: No    Comment: Used in the past   Sexual activity: Yes  Other Topics Concern   Not on file  Social History Narrative   Not on file   Social Drivers of Health   Financial Resource Strain: Low Risk  (04/21/2021)   Overall Financial Resource Strain (CARDIA)    Difficulty of Paying Living Expenses: Not hard at all  Food Insecurity: No Food Insecurity (04/21/2021)   Hunger Vital Sign    Worried About Running Out of Food in the Last Year: Never true  Ran Out of Food in the Last Year: Never true  Transportation Needs: No Transportation Needs (04/21/2021)   PRAPARE - Administrator, Civil Service (Medical): No    Lack of Transportation (Non-Medical): No  Physical Activity: Not on file  Stress: No Stress Concern Present (04/21/2021)   [employer] of Occupational Health - Occupational Stress Questionnaire    Feeling of Stress : Not at all  Social Connections: Not on file  Intimate Partner Violence: Not on file    Review of Systems: See HPI, otherwise negative ROS  Physical Exam: BP 125/82   Pulse 67   Temp 98 F (36.7 C) (Temporal)   Resp 15   Ht 5\' 10"  (1.778 m)   Wt 98.9 kg   SpO2 95%   BMI 31.28 kg/m  General:   Alert, cooperative. Head:   Normocephalic and atraumatic. Respiratory:  Normal work of breathing. Cardiovascular:  NAD  Impression/Plan: Matthew Mercado is here for cataract surgery.  Risks, benefits, limitations, and alternatives regarding cataract surgery have been reviewed with the patient.  Questions have been answered.  All parties agreeable.   Clair Crews, MD  10/19/2023, 8:34 AM

## 2023-10-19 NOTE — Op Note (Signed)
 PREOPERATIVE DIAGNOSIS:  ptrygium OS   POSTOPERATIVE DIAGNOSIS: pterygium OS   OPERATIVE PROCEDURE:pterygium resection with ocular surface reconstruction using glue and Amniograft   SURGEON:  Clair Crews, MD.   ANESTHESIA:  Anesthesiologist: Emilie Harden, MD CRNA: Bill Budd, CRNA  1.      Managed anesthesia care. 2.     0.66ml of Shugarcaine was instilled following the paracentesis   COMPLICATIONS:  None.   TECHNIQUE:   sharp and blunt resection with ocular surface reconstruction OS   DESCRIPTION OF PROCEDURE:  The patient was examined and consented in the preoperative holding area where the aforementioned topical anesthesia was applied to the left eye and then brought back to the Operating Room where a retrobulbar block was placed in a atraumatic fashion. The left eye was prepped and draped in the usual sterile ophthalmic fashion and a lid speculum was placed. A 4-0 silfk traction suture was placed in the limbus. The lesion was resected in a sharp and blunt fashion. The scar tissue in the cornea was scraped with the 54 blade Cautery was used to gain homeostasis. The defect was measured and the graft was trimmed to fit with 2mm overlap on three sides. The Tisseel glue was placed followed by the graft. The edges were tucked and held to secure them. A sof contact lens was placed. The stay suture removed  The eye was dressed with Maxitrol  and a gentle pressure patch was placed..  The patient was also given drops with which to begin a drop regimen today and will follow-up with me in one day. Implant Name Type Inv. Item Serial No. Manufacturer Lot No. LRB No. Used Action  SEALANT FIBRIN TSSL HEMOSTAT 2 - [id number] Miscellaneous SEALANT FIBRIN TSSL HEMOSTAT 2 [id number] [employer] [id number] Left 1 Implanted  AMNIOGRAFT 2.0X1.5 - [id number] Graft AMNIOGRAFT 2.0X1.5 [id number] BIOTISSUE  Left 1 Implanted    Procedure(s): EXCISION, PTERYGIUM  (Left)  Electronically signed: Clair Crews 10/19/2023 9:14 AM

## 2023-10-19 NOTE — Transfer of Care (Signed)
 Immediate Anesthesia Transfer of Care Note  Patient: Matthew Mercado  Procedure(s) Performed: EXCISION, PTERYGIUM (Left: Eye)  Patient Location: PACU  Anesthesia Type: MAC  Level of Consciousness: awake, alert  and patient cooperative  Airway and Oxygen Therapy: Patient Spontanous Breathing and Patient connected to supplemental oxygen  Post-op Assessment: Post-op Vital signs reviewed, Patient's Cardiovascular Status Stable, Respiratory Function Stable, Patent Airway and No signs of Nausea or vomiting  Post-op Vital Signs: Reviewed and stable  Complications: No notable events documented.

## 2023-10-20 ENCOUNTER — Encounter: Payer: Self-pay | Admitting: Ophthalmology

## 2023-10-21 ENCOUNTER — Encounter: Payer: Self-pay | Admitting: Ophthalmology

## 2023-10-22 ENCOUNTER — Other Ambulatory Visit: Payer: Self-pay

## 2023-10-22 MED ORDER — NEOMYCIN-POLYMYXIN-DEXAMETH 3.5-10000-0.1 OP OINT
1.0000 | TOPICAL_OINTMENT | Freq: Every day | OPHTHALMIC | 1 refills | Status: AC
  Filled 2023-10-22: qty 3.5, 7d supply, fill #0

## 2023-10-26 ENCOUNTER — Encounter: Payer: Self-pay | Admitting: Ophthalmology

## 2023-10-27 ENCOUNTER — Ambulatory Visit: Payer: Self-pay | Admitting: Nurse Practitioner

## 2023-10-27 DIAGNOSIS — Z23 Encounter for immunization: Secondary | ICD-10-CM

## 2023-10-27 DIAGNOSIS — R7989 Other specified abnormal findings of blood chemistry: Secondary | ICD-10-CM

## 2023-10-27 DIAGNOSIS — E781 Pure hyperglyceridemia: Secondary | ICD-10-CM

## 2023-10-27 DIAGNOSIS — K7030 Alcoholic cirrhosis of liver without ascites: Secondary | ICD-10-CM

## 2023-10-27 DIAGNOSIS — F1011 Alcohol abuse, in remission: Secondary | ICD-10-CM

## 2023-10-27 DIAGNOSIS — K739 Chronic hepatitis, unspecified: Secondary | ICD-10-CM

## 2023-10-27 DIAGNOSIS — R7301 Impaired fasting glucose: Secondary | ICD-10-CM

## 2023-11-09 ENCOUNTER — Encounter: Payer: Self-pay | Admitting: Ophthalmology

## 2024-01-21 ENCOUNTER — Other Ambulatory Visit: Payer: Self-pay | Admitting: Gastroenterology

## 2024-01-21 ENCOUNTER — Other Ambulatory Visit: Payer: Self-pay | Admitting: Nurse Practitioner

## 2024-01-21 ENCOUNTER — Other Ambulatory Visit: Payer: Self-pay

## 2024-01-24 ENCOUNTER — Other Ambulatory Visit: Payer: Self-pay

## 2024-01-24 ENCOUNTER — Ambulatory Visit: Payer: Self-pay

## 2024-01-24 DIAGNOSIS — M7021 Olecranon bursitis, right elbow: Secondary | ICD-10-CM | POA: Diagnosis not present

## 2024-01-24 MED FILL — Carvedilol Tab 3.125 MG: ORAL | 30 days supply | Qty: 120 | Fill #0 | Status: AC

## 2024-01-24 NOTE — Telephone Encounter (Signed)
 FYI Only or Action Required?: FYI only for provider.  Patient was last seen in primary care on 04/28/2023 by Valerio Melanie DASEN, NP.  Called Nurse Triage reporting Joint Swelling.  Symptoms began a week ago.  Interventions attempted: Rest, hydration, or home remedies and Ice/heat application.  Symptoms are: unchanged.  Triage Disposition: See HCP Within 4 Hours (Or PCP Triage)-recommended to Urgent Care to be evaluated.   Patient/caregiver understands and will follow disposition?: Yes  Copied from CRM [id number]. Topic: Clinical - Red Word Triage >> Jan 24, 2024 12:28 PM Ivette P wrote: Red Word that prompted transfer to Nurse Triage: versaites, elbow and size of a golf ball   Swelling right elbow. Reason for Disposition  [1] Looks infected (e.g., spreading redness, pus) AND [2] large red area (> 2 inches or 5 cm)  Answer Assessment - Initial Assessment Questions 1. LOCATION: Where is the swelling? (e.g., left, right, both elbows)     Right elbow swelling 2. SIZE and DESCRIPTION: What does the swelling look like? (e.g., entire elbow, localized)     Swelling the size of a golf ball 3. ONSET: When did the swelling start? Does it come and go, or is it there all the time?     Week and half ago 4. WORK OR EXERCISE: Has there been any recent work, exercise or other activity that involved that part of the body?      no 5. AGGRAVATING FACTORS: What makes the elbow swelling worse? (e.g., work, sports activities)     Nothing seems to make it worse.  6. ASSOCIATED SYMPTOMS: Is there any pain or redness?     redness 7. OTHER SYMPTOMS: Do you have any other symptoms? (e.g., fever)     Red and warm to the touch  Protocols used: Elbow Swelling-A-AH

## 2024-01-24 NOTE — Telephone Encounter (Signed)
 Requested medication (s) are due for refill today: na  Requested medication (s) are on the active medication list: yes  Last refill:  10/14/23 #120 0 refills  Future visit scheduled: yes 10/ 31/25  Notes to clinic:  last ordered by Rogelia Copping, MD 10/14/23 > do you want to order Rx?     Requested Prescriptions  Pending Prescriptions Disp Refills   carvedilol  (COREG ) 3.125 MG tablet 120 tablet 0    Sig: Take 2 tablets (6.25 mg total) by mouth 2 (two) times daily.     Cardiovascular: Beta Blockers 3 Failed - 01/24/2024  1:34 PM      Failed - Valid encounter within last 6 months    Recent Outpatient Visits   None            Passed - Cr in normal range and within 360 days    Creatinine, Ser  Date Value Ref Range Status  04/22/2023 1.00 0.61 - 1.24 mg/dL Final         Passed - AST in normal range and within 360 days    AST  Date Value Ref Range Status  04/22/2023 26 15 - 41 U/L Final         Passed - ALT in normal range and within 360 days    ALT  Date Value Ref Range Status  04/22/2023 19 0 - 44 U/L Final   ALT (SGPT) P5P  Date Value Ref Range Status  04/17/2021 121 (H) 0 - 55 IU/L Final         Passed - Last BP in normal range    BP Readings from Last 1 Encounters:  10/19/23 123/89         Passed - Last Heart Rate in normal range    Pulse Readings from Last 1 Encounters:  10/19/23 60

## 2024-01-28 ENCOUNTER — Other Ambulatory Visit: Payer: Self-pay

## 2024-04-26 NOTE — Patient Instructions (Signed)
 Healthy Eating, Adult Healthy eating may help you get and keep a healthy body weight, reduce the risk of chronic disease, and live a long and productive life. It is important to follow a healthy eating pattern. Your nutritional and calorie needs should be met mainly by different nutrient-rich foods. What are tips for following this plan? Reading food labels Read labels and choose the following: Reduced or low sodium products. Juices with 100% fruit juice. Foods with low saturated fats (<3 g per serving) and high polyunsaturated and monounsaturated fats. Foods with whole grains, such as whole wheat, cracked wheat, brown rice, and wild rice. Whole grains that are fortified with folic acid . This is recommended for females who are pregnant or who want to become pregnant. Read labels and do not eat or drink the following: Foods or drinks with added sugars. These include foods that contain brown sugar, corn sweetener, corn syrup, dextrose , fructose, glucose, high-fructose corn syrup, honey, invert sugar, lactose, malt syrup, maltose, molasses, raw sugar, sucrose, trehalose, or turbinado sugar. Limit your intake of added sugars to less than 10% of your total daily calories. Do not eat more than the following amounts of added sugar per day: 6 teaspoons (25 g) for females. 9 teaspoons (38 g) for males. Foods that contain processed or refined starches and grains. Refined grain products, such as white flour, degermed cornmeal, white bread, and white rice. Shopping Choose nutrient-rich snacks, such as vegetables, whole fruits, and nuts. Avoid high-calorie and high-sugar snacks, such as potato chips, fruit snacks, and candy. Use oil-based dressings and spreads on foods instead of solid fats such as butter, margarine, sour cream, or cream cheese. Limit pre-made sauces, mixes, and instant products such as flavored rice, instant noodles, and ready-made pasta. Try more plant-protein sources, such as tofu,  tempeh, black beans, edamame, lentils, nuts, and seeds. Explore eating plans such as the Mediterranean diet or vegetarian diet. Try heart-healthy dips made with beans and healthy fats like hummus and guacamole. Vegetables go great with these. Cooking Use oil to saut or stir-fry foods instead of solid fats such as butter, margarine, or lard. Try baking, boiling, grilling, or broiling instead of frying. Remove the fatty part of meats before cooking. Steam vegetables in water  or broth. Meal planning  At meals, imagine dividing your plate into fourths: One-half of your plate is fruits and vegetables. One-fourth of your plate is whole grains. One-fourth of your plate is protein, especially lean meats, poultry, eggs, tofu, beans, or nuts. Include low-fat dairy as part of your daily diet. Lifestyle Choose healthy options in all settings, including home, work, school, restaurants, or stores. Prepare your food safely: Wash your hands after handling raw meats. Where you prepare food, keep surfaces clean by regularly washing with hot, soapy water . Keep raw meats separate from ready-to-eat foods, such as fruits and vegetables. Cook seafood, meat, poultry, and eggs to the recommended temperature. Get a food thermometer. Store foods at safe temperatures. In general: Keep cold foods at 34F (4.4C) or below. Keep hot foods at 134F (60C) or above. Keep your freezer at [facility] (-17.8C) or below. Foods are not safe to eat if they have been between the temperatures of 40-134F (4.4-60C) for more than 2 hours. What foods should I eat? Fruits Aim to eat 1-2 cups of fresh, canned (in natural juice), or frozen fruits each day. One cup of fruit equals 1 small apple, 1 large banana, 8 large strawberries, 1 cup (237 g) canned fruit,  cup (82 g) dried fruit,  or 1 cup (240 mL) 100% juice. Vegetables Aim to eat 2-4 cups of fresh and frozen vegetables each day, including different varieties and colors. One cup  of vegetables equals 1 cup (91 g) broccoli or cauliflower florets, 2 medium carrots, 2 cups (150 g) raw, leafy greens, 1 large tomato, 1 large bell pepper, 1 large sweet potato, or 1 medium white potato. Grains Aim to eat 5-10 ounce-equivalents of whole grains each day. Examples of 1 ounce-equivalent of grains include 1 slice of bread, 1 cup (40 g) ready-to-eat cereal, 3 cups (24 g) popcorn, or  cup (93 g) cooked rice. Meats and other proteins Try to eat 5-7 ounce-equivalents of protein each day. Examples of 1 ounce-equivalent of protein include 1 egg,  oz nuts (12 almonds, 24 pistachios, or 7 walnut halves), 1/4 cup (90 g) cooked beans, 6 tablespoons (90 g) hummus or 1 tablespoon (16 g) peanut butter. A cut of meat or fish that is the size of a deck of cards is about 3-4 ounce-equivalents (85 g). Of the protein you eat each week, try to have at least 8 sounce (227 g) of seafood. This is about 2 servings per week. This includes salmon, trout, herring, sardines, and anchovies. Dairy Aim to eat 3 cup-equivalents of fat-free or low-fat dairy each day. Examples of 1 cup-equivalent of dairy include 1 cup (240 mL) milk, 8 ounces (250 g) yogurt, 1 ounces (44 g) natural cheese, or 1 cup (240 mL) fortified soy milk. Fats and oils Aim for about 5 teaspoons (21 g) of fats and oils per day. Choose monounsaturated fats, such as canola and olive oils, mayonnaise made with olive oil or avocado oil, avocados, peanut butter, and most nuts, or polyunsaturated fats, such as sunflower, corn, and soybean oils, walnuts, pine nuts, sesame seeds, sunflower seeds, and flaxseed. Beverages Aim for 6 eight-ounce glasses of water  per day. Limit coffee to 3-5 eight-ounce cups per day. Limit caffeinated beverages that have added calories, such as soda and energy drinks. If you drink alcohol: Limit how much you have to: 0-1 drink a day if you are male. 0-2 drinks a day if you are male. Know how much alcohol is in your drink.  In the [place] one drink is one 12 oz bottle of beer (355 mL), one 5 oz glass of wine (148 mL), or one 1 oz glass of hard liquor (44 mL). Seasoning and other foods Try not to add too much salt to your food. Try using herbs and spices instead of salt. Try not to add sugar to food. This information is based on [place] nutrition guidelines. To learn more, visit DisposableN[employer].be. Exact amounts may vary. You may need different amounts. This information is not intended to replace advice given to you by your health care provider. Make sure you discuss any questions you have with your health care provider. Document Revised: 03/16/2022 Document Reviewed: 03/16/2022 Elsevier Patient Education  2024 [employer].

## 2024-04-28 ENCOUNTER — Ambulatory Visit (INDEPENDENT_AMBULATORY_CARE_PROVIDER_SITE_OTHER): Admitting: Nurse Practitioner

## 2024-04-28 ENCOUNTER — Encounter: Payer: Self-pay | Admitting: Nurse Practitioner

## 2024-04-28 ENCOUNTER — Other Ambulatory Visit: Payer: Self-pay

## 2024-04-28 VITALS — BP 125/78 | HR 79 | Temp 98.4°F | Resp 15 | Ht 70.0 in | Wt 213.8 lb

## 2024-04-28 DIAGNOSIS — N400 Enlarged prostate without lower urinary tract symptoms: Secondary | ICD-10-CM | POA: Diagnosis not present

## 2024-04-28 DIAGNOSIS — Z0000 Encounter for general adult medical examination without abnormal findings: Secondary | ICD-10-CM | POA: Diagnosis not present

## 2024-04-28 DIAGNOSIS — K7460 Unspecified cirrhosis of liver: Secondary | ICD-10-CM

## 2024-04-28 DIAGNOSIS — F1011 Alcohol abuse, in remission: Secondary | ICD-10-CM

## 2024-04-28 DIAGNOSIS — E781 Pure hyperglyceridemia: Secondary | ICD-10-CM | POA: Diagnosis not present

## 2024-04-28 DIAGNOSIS — R0683 Snoring: Secondary | ICD-10-CM | POA: Insufficient documentation

## 2024-04-28 DIAGNOSIS — G4733 Obstructive sleep apnea (adult) (pediatric): Secondary | ICD-10-CM | POA: Insufficient documentation

## 2024-04-28 DIAGNOSIS — Z23 Encounter for immunization: Secondary | ICD-10-CM | POA: Diagnosis not present

## 2024-04-28 DIAGNOSIS — R7301 Impaired fasting glucose: Secondary | ICD-10-CM

## 2024-04-28 DIAGNOSIS — K739 Chronic hepatitis, unspecified: Secondary | ICD-10-CM

## 2024-04-28 MED ORDER — CARVEDILOL 3.125 MG PO TABS
6.2500 mg | ORAL_TABLET | Freq: Two times a day (BID) | ORAL | 0 refills | Status: AC
  Filled 2024-04-28: qty 120, 30d supply, fill #0

## 2024-04-28 MED ORDER — FUROSEMIDE 40 MG PO TABS
40.0000 mg | ORAL_TABLET | Freq: Every day | ORAL | 0 refills | Status: AC
  Filled 2024-04-28: qty 90, 90d supply, fill #0

## 2024-04-28 NOTE — Assessment & Plan Note (Signed)
 Has been sober for 3 years, praised for this.

## 2024-04-28 NOTE — Assessment & Plan Note (Signed)
 Chronic, ongoing.  Followed by GI.  Will continue this collaboration on as needed basis since his provider left practice.  Recent notes and labs reviewed.  Labs today. Ordered ultrasound as due for this.

## 2024-04-28 NOTE — Assessment & Plan Note (Signed)
 Noted on past labs, no current medications.  Recommend heavy focus on diet changes, we discussed these changes today.  If ongoing elevations could consider medication.  LDL is 70 range.

## 2024-04-28 NOTE — Assessment & Plan Note (Signed)
Sleep study referral placed.  

## 2024-04-28 NOTE — Progress Notes (Signed)
 BP 125/78 (BP Location: Left Arm, Patient Position: Sitting, Cuff Size: Large)   Pulse 79   Temp 98.4 F (36.9 C) (Oral)   Resp 15   Ht 5' 10 (1.778 m)   Wt 213 lb 12.8 oz (97 kg)   SpO2 97%   BMI 30.68 kg/m    Subjective:    Patient ID: Matthew Mercado, male    DOB: [birth year], 63 y.o.   MRN: [id number]  HPI: NUR KRASINSKI is a 63 y.o. male presenting on 04/28/2024 for comprehensive medical examination. Current medical complaints include:none  He currently lives with: wife Interim Problems from his last visit: no  The 10-year ASCVD risk score (Arnett DK, et al., 2019) is: 7%   Values used to calculate the score:     Age: 67 years     Clincally relevant sex: Male     Is Non-Hispanic African American: No     Diabetic: No     Tobacco smoker: No     Systolic Blood Pressure: 125 mmHg     Is BP treated: No     HDL Cholesterol: 49 mg/dL     Total Cholesterol: 132 mg/dL  SLEEP APNEA Is requesting sleep study. Wife has heard him gasping and quit breathing.  Sleep apnea testing medical necessity as below:  [x]  Witnessed apnea during sleep [x]  Evidence of Excessive Daytime Sleepiness  [x]  Disturbed or restless sleep   [x]  Non restorative sleep  []  Frequent unexplained arousalsfrom sleep  []  Fragmented sleep  [x]  Epworth Sleepiness Scale (ESS) greater than or equal to 10  [x]  Fatigue  [x]   Evidence suggestive of Sleep Disordered Breathing [x]  Habitual loud snoring [x]  Choking or gasping during sleep [x]  BMI greater than or equal to 30 []  Neck circumference greater than 17 in. (men) or greater than 16 in. (women) []  Sleep related bruxism []  Cognitive deficits such as inattention or memory []  Unexplained nocturnalreflux []  Erectile dysfunction []  Apneas or hypoxemia during proceduresrequiring anesthesia [x]  Morning headaches Wakes feeling refreshed:  no Daytime hypersomnolence:  no Fatigue:  yes Insomnia:  yes Good sleep hygiene:  yes Difficulty falling  asleep:  yes Difficulty staying asleep:  no Snoring bothers bed partner:  yes Observed apnea by bed partner: yes Obesity:  yes Hypertension: no  Pulmonary hypertension:  no Coronary artery disease:  no    04/28/2024    2:00 PM  Results of the Epworth flowsheet  Sitting and reading 2  Watching TV 3  Sitting, inactive in a public place (e.g. a theatre or a meeting) 3  As a passenger in a car for an hour without a break 3  Lying down to rest in the afternoon when circumstances permit 2  Sitting and talking to someone 3  Sitting quietly after a lunch without alcohol 3  In a car, while stopped for a few minutes in traffic 3  Total score 22    CIRRHOSIS & CHRONIC HEPATITIS Followed with Dr. Jinny at GI in past.  Last visit 09/28/22 and colonoscopy 12/15/22.  Takes Lasix  and Carvedilol .  On the 22nd of October 2024 was three years alcohol free. Went through hepatitis treatment in past, last labs showed this cleared. Has chronic tinnitus to both ears post Hep C treatment, this is improving now.   Duration:months Treatments attempted: as above Fever: no Nausea: no Vomiting: no Weight loss: no Decreased appetite: no Diarrhea: no Constipation: no Blood in stool: no Heartburn: no Jaundice: no Rash: no  Impaired Fasting Glucose  HbA1C:  Lab Results  Component Value Date   HGBA1C 4.9 04/22/2023  Duration of elevated blood sugar: chronic Polydipsia: no Polyuria: no Weight change: no Visual disturbance: no Glucose Monitoring: no    Accucheck frequency: Not Checking    Fasting glucose:     Post prandial:  Diabetic Education: Not Completed Family history of diabetes: no   Functional Status Survey: Is the patient deaf or have difficulty hearing?: No Does the patient have difficulty seeing, even when wearing glasses/contacts?: No Does the patient have difficulty concentrating, remembering, or making decisions?: No Does the patient have difficulty walking or climbing stairs?:  No Does the patient have difficulty dressing or bathing?: No Does the patient have difficulty doing errands alone such as visiting a doctor's office or shopping?: No  FALL RISK:    04/28/2024    2:32 PM 10/27/2022    2:18 PM 06/18/2022    1:55 PM 09/09/2021   10:08 AM 05/19/2021    2:35 PM  Fall Risk   Falls in the past year? 0 0 0 0 0  Number falls in past yr: 0 0 0 0 0  Injury with Fall? 0 0 0 0 0  Risk for fall due to : No Fall Risks No Fall Risks No Fall Risks No Fall Risks No Fall Risks  Follow up Falls evaluation completed Falls evaluation completed Falls evaluation completed  Falls evaluation completed  Falls evaluation completed      Data saved with a previous flowsheet row definition   Depression Screen    04/28/2024    2:32 PM 04/28/2023    1:07 PM 10/27/2022    2:08 PM 06/18/2022    1:55 PM 04/28/2022    2:36 PM  Depression screen PHQ 2/9  Decreased Interest 0 0 0 0 0  Down, Depressed, Hopeless 0 0 0 0 0  PHQ - 2 Score 0 0 0 0 0  Altered sleeping 2 0 0 0 0  Tired, decreased energy 0 0 0 0 0  Change in appetite 0 0 0 0 1  Feeling bad or failure about yourself  0 0 0 0 0  Trouble concentrating 0 0 0 0 0  Moving slowly or fidgety/restless 0 0 0 0 0  Suicidal thoughts 0 0 0 0 0  PHQ-9 Score 2 0 0 0 1  Difficult doing work/chores    Not difficult at all Not difficult at all      04/28/2024    2:32 PM 04/28/2023    1:07 PM 10/27/2022    2:08 PM 06/18/2022    1:55 PM  GAD 7 : Generalized Anxiety Score  Nervous, Anxious, on Edge 0 0 0 0  Control/stop worrying 0 0 0 0  Worry too much - different things 0 0 0 0  Trouble relaxing 0 0 0 0  Restless 0 0 0 0  Easily annoyed or irritable 3 0 0 0  Afraid - awful might happen 0 0 0 0  Total GAD 7 Score 3 0 0 0  Anxiety Difficulty   Not difficult at all Not difficult at all   Past Medical History:  Past Medical History:  Diagnosis Date   Cervical spine degeneration    Cirrhosis of liver without ascites (HCC)  04/15/2021   Depression    ETOH abuse 04/02/2021   Hepatitis C     Surgical History:  Past Surgical History:  Procedure Laterality Date   CHOLECYSTECTOMY     COLONOSCOPY WITH PROPOFOL  N/A 12/08/2017  Procedure: COLONOSCOPY WITH PROPOFOL ;  Surgeon: Jinny Carmine, MD;  Location: [facility];  Service: Endoscopy;  Laterality: N/A;   COLONOSCOPY WITH PROPOFOL  N/A 12/15/2022   Procedure: COLONOSCOPY WITH PROPOFOL ;  Surgeon: Jinny Carmine, MD;  Location: [facility] ENDOSCOPY;  Service: Endoscopy;  Laterality: N/A;   ESOPHAGOGASTRODUODENOSCOPY (EGD) WITH PROPOFOL  N/A 05/26/2021   Procedure: ESOPHAGOGASTRODUODENOSCOPY (EGD) WITH PROPOFOL ;  Surgeon: Jinny Carmine, MD;  Location: [facility];  Service: Endoscopy;  Laterality: N/A;   POLYPECTOMY  12/15/2022   Procedure: POLYPECTOMY;  Surgeon: Jinny Carmine, MD;  Location: [facility] ENDOSCOPY;  Service: Endoscopy;;   PTERYGIUM EXCISION Left 10/19/2023   Procedure: EXCISION, PTERYGIUM;  Surgeon: Jaye Fallow, MD;  Location: [facility];  Service: Ophthalmology;  Laterality: Left;   SPINE SURGERY     discectomy and fusion C 4/5   TONSILLECTOMY      Medications:  Current Outpatient Medications on File Prior to Visit  Medication Sig   cetirizine (ZYRTEC) 10 MG tablet Take 10 mg by mouth daily.   Multiple Vitamins-Minerals (MENS MULTIVITAMIN PO) Take by mouth.   neomycin -polymyxin b-dexamethasone  (MAXITROL ) 3.5-10000-0.1 OINT Apply a small amount into affected eye at bedtime.   oxyCODONE -acetaminophen  (PERCOCET/ROXICET) 5-325 MG tablet Take 1 tablet by mouth every 4 (four) hours as needed for pain.   RIBOFLAVIN PO Take by mouth.   No current facility-administered medications on file prior to visit.   Allergies:  No Known Allergies  Social History:  Social History   Socioeconomic History   Marital status: Married    Spouse name: Not on file   Number of children: Not on file   Years of education: Not on file   Highest education  level: Not on file  Occupational History   Not on file  Tobacco Use   Smoking status: Former    Current packs/day: 0.00    Types: Cigarettes    Quit date: 11/12/2010    Years since quitting: 13.4   Smokeless tobacco: Never  Vaping Use   Vaping status: Never Used  Substance and Sexual Activity   Alcohol use: Not Currently    Comment: Quit approx 2022   Drug use: No    Comment: Used in the past   Sexual activity: Yes  Other Topics Concern   Not on file  Social History Narrative   Not on file   Social Drivers of Health   Financial Resource Strain: Low Risk  (04/21/2021)   Overall Financial Resource Strain (CARDIA)    Difficulty of Paying Living Expenses: Not hard at all  Food Insecurity: No Food Insecurity (04/21/2021)   Hunger Vital Sign    Worried About Running Out of Food in the Last Year: Never true    Ran Out of Food in the Last Year: Never true  Transportation Needs: No Transportation Needs (04/21/2021)   PRAPARE - Administrator, Civil Service (Medical): No    Lack of Transportation (Non-Medical): No  Physical Activity: Not on file  Stress: No Stress Concern Present (04/21/2021)   [employer] of Occupational Health - Occupational Stress Questionnaire    Feeling of Stress : Not at all  Social Connections: Not on file  Intimate Partner Violence: Not on file   Social History   Tobacco Use  Smoking Status Former   Current packs/day: 0.00   Types: Cigarettes   Quit date: 11/12/2010   Years since quitting: 13.4  Smokeless Tobacco Never   Social History   Substance and Sexual Activity  Alcohol Use Not  Currently   Comment: Quit approx 2022    Family History:  Family History  Problem Relation Age of Onset   Cancer Mother    Cancer Father        prostate   Hypertension Father    Sleep apnea Sister    Hypertension Sister    Drug abuse Sister     Past medical history, surgical history, medications, allergies, family history and social  history reviewed with patient today and changes made to appropriate areas of the chart.   ROS All other ROS negative except what is listed above and in the HPI.      Objective:    BP 125/78 (BP Location: Left Arm, Patient Position: Sitting, Cuff Size: Large)   Pulse 79   Temp 98.4 F (36.9 C) (Oral)   Resp 15   Ht 5' 10 (1.778 m)   Wt 213 lb 12.8 oz (97 kg)   SpO2 97%   BMI 30.68 kg/m   Wt Readings from Last 3 Encounters:  04/28/24 213 lb 12.8 oz (97 kg)  10/19/23 218 lb (98.9 kg)  04/28/23 220 lb 6.4 oz (100 kg)    Physical Exam Vitals and nursing note reviewed.  Constitutional:      General: He is awake. He is not in acute distress.    Appearance: He is well-developed and well-groomed. He is obese. He is not ill-appearing or toxic-appearing.  HENT:     Head: Normocephalic and atraumatic.     Right Ear: Hearing, tympanic membrane, ear canal and external ear normal. No drainage.     Left Ear: Hearing, tympanic membrane, ear canal and external ear normal. No drainage.     Nose: Nose normal.     Mouth/Throat:     Pharynx: Uvula midline.  Eyes:     General: Lids are normal.        Right eye: No discharge.        Left eye: No discharge.     Extraocular Movements: Extraocular movements intact.     Conjunctiva/sclera: Conjunctivae normal.     Pupils: Pupils are equal, round, and reactive to light.     Visual Fields: Right eye visual fields normal and left eye visual fields normal.  Neck:     Thyroid : No thyromegaly.     Vascular: No carotid bruit or JVD.     Trachea: Trachea normal.  Cardiovascular:     Rate and Rhythm: Normal rate and regular rhythm.     Heart sounds: Normal heart sounds, S1 normal and S2 normal. No murmur heard.    No gallop.  Pulmonary:     Effort: Pulmonary effort is normal. No accessory muscle usage or respiratory distress.     Breath sounds: Normal breath sounds.  Abdominal:     General: Bowel sounds are normal.     Palpations: Abdomen is  soft. There is no hepatomegaly or splenomegaly.     Tenderness: There is no abdominal tenderness.  Musculoskeletal:        General: Normal range of motion.     Cervical back: Normal range of motion and neck supple.     Right lower leg: No edema.     Left lower leg: No edema.  Lymphadenopathy:     Head:     Right side of head: No submental, submandibular, tonsillar, preauricular or posterior auricular adenopathy.     Left side of head: No submental, submandibular, tonsillar, preauricular or posterior auricular adenopathy.     Cervical: No  cervical adenopathy.  Skin:    General: Skin is warm and dry.     Capillary Refill: Capillary refill takes less than 2 seconds.     Findings: No rash.  Neurological:     Mental Status: He is alert and oriented to person, place, and time.     Gait: Gait is intact.     Deep Tendon Reflexes: Reflexes are normal and symmetric.     Reflex Scores:      Brachioradialis reflexes are 2+ on the right side and 2+ on the left side.      Patellar reflexes are 2+ on the right side and 2+ on the left side. Psychiatric:        Attention and Perception: Attention normal.        Mood and Affect: Mood normal.        Speech: Speech normal.        Behavior: Behavior normal. Behavior is cooperative.        Thought Content: Thought content normal.        Cognition and Memory: Cognition normal.     Results for orders placed or performed during the hospital encounter of 04/22/23  Lipid panel   Collection Time: 04/22/23  8:28 AM  Result Value Ref Range   Cholesterol 132 0 - 200 mg/dL   Triglycerides 67 <849 mg/dL   HDL 49 >59 mg/dL   Total CHOL/HDL Ratio 2.7 RATIO   VLDL 13 0 - 40 mg/dL   LDL Cholesterol 70 0 - 99 mg/dL  CBC with Differential/Platelet   Collection Time: 04/22/23  8:28 AM  Result Value Ref Range   WBC 7.4 4.0 - 10.5 K/uL   RBC 5.23 4.22 - 5.81 MIL/uL   Hemoglobin 16.5 13.0 - 17.0 g/dL   HCT 51.8 60.9 - 47.9 %   MCV 92.0 80.0 - 100.0 fL   MCH  31.5 26.0 - 34.0 pg   MCHC 34.3 30.0 - 36.0 g/dL   RDW 87.4 88.4 - 84.4 %   Platelets 204 150 - 400 K/uL   nRBC 0.0 0.0 - 0.2 %   Neutrophils Relative % 61 %   Neutro Abs 4.5 1.7 - 7.7 K/uL   Lymphocytes Relative 21 %   Lymphs Abs 1.6 0.7 - 4.0 K/uL   Monocytes Relative 9 %   Monocytes Absolute 0.7 0.1 - 1.0 K/uL   Eosinophils Relative 8 %   Eosinophils Absolute 0.6 (H) 0.0 - 0.5 K/uL   Basophils Relative 1 %   Basophils Absolute 0.1 0.0 - 0.1 K/uL   Immature Granulocytes 0 %   Abs Immature Granulocytes 0.03 0.00 - 0.07 K/uL  Comprehensive metabolic panel   Collection Time: 04/22/23  8:28 AM  Result Value Ref Range   Sodium 137 135 - 145 mmol/L   Potassium 3.6 3.5 - 5.1 mmol/L   Chloride 100 98 - 111 mmol/L   CO2 29 22 - 32 mmol/L   Glucose, Bld 112 (H) 70 - 99 mg/dL   BUN 18 8 - 23 mg/dL   Creatinine, Ser 8.99 0.61 - 1.24 mg/dL   Calcium 8.7 (L) 8.9 - 10.3 mg/dL   Total Protein 7.4 6.5 - 8.1 g/dL   Albumin 4.4 3.5 - 5.0 g/dL   AST 26 15 - 41 U/L   ALT 19 0 - 44 U/L   Alkaline Phosphatase 58 38 - 126 U/L   Total Bilirubin 1.1 0.3 - 1.2 mg/dL   GFR, Estimated >39 >39 mL/min   Anion  gap 8 5 - 15  TSH   Collection Time: 04/22/23  8:28 AM  Result Value Ref Range   TSH 1.461 0.350 - 4.500 uIU/mL  PSA   Collection Time: 04/22/23  8:28 AM  Result Value Ref Range   Prostatic Specific Antigen 1.26 0.00 - 4.00 ng/mL  HgB A1c   Collection Time: 04/22/23  8:28 AM  Result Value Ref Range   Hgb A1c MFr Bld 4.9 4.8 - 5.6 %   Mean Plasma Glucose 93.93 mg/dL      Assessment & Plan:   Problem List Items Addressed This Visit       Digestive   Cirrhosis of liver without ascites (HCC) - Primary   Chronic, ongoing.  Followed by GI.  Will continue this collaboration on as needed basis since his provider left practice.  Recent notes and labs reviewed.  Labs today. Ordered ultrasound as due for this.      Relevant Orders   CBC with Differential/Platelet   Comprehensive metabolic  panel with GFR   TSH   US  Abdomen Limited RUQ (LIVER/GB)   Chronic hepatitis (HCC)   Chronic, ongoing.  Followed by GI.  Will continue this collaboration on as needed basis since his provider left practice.  Recent notes and labs reviewed.  Labs today. Ordered ultrasound as due for this.      Relevant Orders   Comprehensive metabolic panel with GFR   US  Abdomen Limited RUQ (LIVER/GB)     Endocrine   IFG (impaired fasting glucose)   Ongoing elevation glucose on labs, but A1c is well below prediabetes or diabetes level.  Focus on diet changes and regular exercise.      Relevant Orders   HgB A1c     Other   Snoring   Sleep study referral placed.      Relevant Orders   Ambulatory referral to Sleep Studies   Hypertriglyceridemia without hypercholesterolemia   Noted on past labs, no current medications.  Recommend heavy focus on diet changes, we discussed these changes today.  If ongoing elevations could consider medication.  LDL is 70 range.      Relevant Medications   carvedilol  (COREG ) 3.125 MG tablet   furosemide  (LASIX ) 40 MG tablet   Other Relevant Orders   Comprehensive metabolic panel with GFR   Lipid Panel w/o Chol/HDL Ratio   History of alcohol abuse   Has been sober for 3 years, praised for this.      Other Visit Diagnoses       Benign prostatic hyperplasia without lower urinary tract symptoms       PSA on labs today.   Relevant Orders   PSA     Flu vaccine need       Flu vaccine today, educated patient.   Relevant Orders   Flu vaccine trivalent PF, 6mos and older(Flulaval,Afluria,Fluarix,Fluzone) (Completed)     Need for COVID-19 vaccine       Covid vaccine provided today.   Relevant Orders   Pfizer Comirnaty Covid-19 Vaccine 66yrs & older (Completed)     Encounter for annual physical exam       Annual physical today with labs and health maintenance reviewed, discussed with patient.       Discussed aspirin prophylaxis for myocardial infarction  prevention and decision was   LABORATORY TESTING:  Health maintenance labs ordered today as discussed above.   The natural history of prostate cancer and ongoing controversy regarding screening and potential treatment outcomes of prostate cancer has  been discussed with the patient. The meaning of a false positive PSA and a false negative PSA has been discussed. He indicates understanding of the limitations of this screening test and wishes to proceed with screening PSA testing.   IMMUNIZATIONS:   - Tdap: Tetanus vaccination status reviewed: last tetanus booster within 10 years. - Influenza: Administered today - Pneumovax: Not applicable - Prevnar: Not applicable - Zostavax vaccine: Up to date - Covid: Administered today  SCREENING: - Colonoscopy: Up to date  Discussed with patient purpose of the colonoscopy is to detect colon cancer at curable precancerous or early stages   - AAA Screening: Not applicable  -Hearing Test: Not applicable  -Spirometry: Not applicable   PATIENT COUNSELING:    Sexuality: Discussed sexually transmitted diseases, partner selection, use of condoms, avoidance of unintended pregnancy  and contraceptive alternatives.   Advised to avoid cigarette smoking.  I discussed with the patient that most people either abstain from alcohol or drink within safe limits (<=14/week and <=4 drinks/occasion for males, <=7/weeks and <= 3 drinks/occasion for females) and that the risk for alcohol disorders and other health effects rises proportionally with the number of drinks per week and how often a drinker exceeds daily limits.  Discussed cessation/primary prevention of drug use and availability of treatment for abuse.   Diet: Encouraged to adjust caloric intake to maintain  or achieve ideal body weight, to reduce intake of dietary saturated fat and total fat, to limit sodium intake by avoiding high sodium foods and not adding table salt, and to maintain adequate dietary  potassium and calcium preferably from fresh fruits, vegetables, and low-fat dairy products.    Stressed the importance of regular exercise  Injury prevention: Discussed safety belts, safety helmets, smoke detector, smoking near bedding or upholstery.   Dental health: Discussed importance of regular tooth brushing, flossing, and dental visits.   Follow up plan: NEXT PREVENTATIVE PHYSICAL DUE IN 1 YEAR. Return in about 1 year (around 04/28/2025) for Annual Physical.

## 2024-04-28 NOTE — Assessment & Plan Note (Signed)
 Ongoing elevation glucose on labs, but A1c is well below prediabetes or diabetes level.  Focus on diet changes and regular exercise.

## 2024-04-28 NOTE — Assessment & Plan Note (Addendum)
 Chronic, ongoing.  Followed by GI.  Will continue this collaboration on as needed basis since his provider left practice.  Recent notes and labs reviewed.  Labs today. Ordered ultrasound as due for this.

## 2024-04-29 ENCOUNTER — Ambulatory Visit: Payer: Self-pay | Admitting: Nurse Practitioner

## 2024-04-29 LAB — CBC WITH DIFFERENTIAL/PLATELET
Basophils Absolute: 0.1 x10E3/uL (ref 0.0–0.2)
Basos: 1 %
EOS (ABSOLUTE): 0.5 x10E3/uL — ABNORMAL HIGH (ref 0.0–0.4)
Eos: 6 %
Hematocrit: 47 % (ref 37.5–51.0)
Hemoglobin: 15.9 g/dL (ref 13.0–17.7)
Immature Grans (Abs): 0 x10E3/uL (ref 0.0–0.1)
Immature Granulocytes: 0 %
Lymphocytes Absolute: 1.5 x10E3/uL (ref 0.7–3.1)
Lymphs: 20 %
MCH: 32.3 pg (ref 26.6–33.0)
MCHC: 33.8 g/dL (ref 31.5–35.7)
MCV: 96 fL (ref 79–97)
Monocytes Absolute: 0.9 x10E3/uL (ref 0.1–0.9)
Monocytes: 11 %
Neutrophils Absolute: 4.8 x10E3/uL (ref 1.4–7.0)
Neutrophils: 62 %
Platelets: 245 x10E3/uL (ref 150–450)
RBC: 4.92 x10E6/uL (ref 4.14–5.80)
RDW: 12.7 % (ref 11.6–15.4)
WBC: 7.8 x10E3/uL (ref 3.4–10.8)

## 2024-04-29 LAB — COMPREHENSIVE METABOLIC PANEL WITH GFR
ALT: 22 IU/L (ref 0–44)
AST: 33 IU/L (ref 0–40)
Albumin: 4.8 g/dL (ref 3.9–4.9)
Alkaline Phosphatase: 95 IU/L (ref 47–123)
BUN/Creatinine Ratio: 14 (ref 10–24)
BUN: 15 mg/dL (ref 8–27)
Bilirubin Total: 1.1 mg/dL (ref 0.0–1.2)
CO2: 26 mmol/L (ref 20–29)
Calcium: 9.6 mg/dL (ref 8.6–10.2)
Chloride: 101 mmol/L (ref 96–106)
Creatinine, Ser: 1.08 mg/dL (ref 0.76–1.27)
Globulin, Total: 2.4 g/dL (ref 1.5–4.5)
Glucose: 91 mg/dL (ref 70–99)
Potassium: 4.2 mmol/L (ref 3.5–5.2)
Sodium: 141 mmol/L (ref 134–144)
Total Protein: 7.2 g/dL (ref 6.0–8.5)
eGFR: 78 mL/min/1.73 (ref >59)

## 2024-04-29 LAB — TSH: TSH: 2.28 u[IU]/mL (ref 0.450–4.500)

## 2024-04-29 LAB — LIPID PANEL W/O CHOL/HDL RATIO
Cholesterol, Total: 156 mg/dL (ref 100–199)
HDL: 55 mg/dL (ref >39)
LDL Chol Calc (NIH): 90 mg/dL (ref 0–99)
Triglycerides: 55 mg/dL (ref 0–149)
VLDL Cholesterol Cal: 11 mg/dL (ref 5–40)

## 2024-04-29 LAB — HEMOGLOBIN A1C
Est. average glucose Bld gHb Est-mCnc: 100 mg/dL
Hgb A1c MFr Bld: 5.1 % (ref 4.8–5.6)

## 2024-04-29 LAB — PSA: Prostate Specific Ag, Serum: 1.2 ng/mL (ref 0.0–4.0)

## 2024-04-29 NOTE — Progress Notes (Signed)
 Contacted via MyChart The 10-year ASCVD risk score (Arnett DK, et al., 2019) is: 7.5%   Values used to calculate the score:     Age: 63 years     Clincally relevant sex: Male     Is Non-Hispanic African American: No     Diabetic: No     Tobacco smoker: No     Systolic Blood Pressure: 125 mmHg     Is BP treated: No     HDL Cholesterol: 55 mg/dL     Total Cholesterol: 156 mg/dL  Good afternoon Matthew Mercado, your labs have returned and overall remain stable. No medication changes needed.  Any questions? Keep being amazing!!  Thank you for allowing me to participate in your care.  I appreciate you. Kindest regards, Khaalid Lefkowitz

## 2024-06-06 ENCOUNTER — Encounter: Payer: Self-pay | Admitting: Nurse Practitioner

## 2024-06-08 ENCOUNTER — Ambulatory Visit
Admission: RE | Admit: 2024-06-08 | Discharge: 2024-06-08 | Attending: Nurse Practitioner | Admitting: Nurse Practitioner

## 2024-06-08 DIAGNOSIS — K7689 Other specified diseases of liver: Secondary | ICD-10-CM | POA: Diagnosis not present

## 2024-06-08 DIAGNOSIS — K7460 Unspecified cirrhosis of liver: Secondary | ICD-10-CM | POA: Insufficient documentation

## 2024-06-08 DIAGNOSIS — K739 Chronic hepatitis, unspecified: Secondary | ICD-10-CM | POA: Insufficient documentation

## 2024-06-08 DIAGNOSIS — Z9049 Acquired absence of other specified parts of digestive tract: Secondary | ICD-10-CM | POA: Diagnosis not present

## 2024-06-10 NOTE — Progress Notes (Signed)
 Contacted via MyChart  Ultrasound continues to show stable cirrhosis, no lesions. Great news.

## 2024-06-14 NOTE — Telephone Encounter (Unsigned)
 Copied from CRM #[id number]. Topic: Clinical - Order For Equipment >> Jun 14, 2024 12:09 PM Suzen RAMAN wrote: Reason for CRM: Patient recieved a full face CPAP machine and patient would like the CPAP device that just covers just his nose. Patient would also like to know if there will be an adjustment to his billing(insurance) with this change as well.     CB#[phone]

## 2024-06-15 NOTE — Telephone Encounter (Signed)
 Returned call to patient. He needs to speak with the DME company to make this change and advised that he reach out directly to them for assistance.

## 2025-04-30 ENCOUNTER — Encounter: Admitting: Nurse Practitioner
